# Patient Record
Sex: Female | Born: 1970 | Race: White | Hispanic: No | Marital: Married | Smoking: Never smoker
Health system: Southern US, Community
[De-identification: ages and names within clinical notes are randomized; demographics above are authoritative.]

## PROBLEM LIST (undated history)

## (undated) DIAGNOSIS — Z8051 Family history of malignant neoplasm of kidney: Secondary | ICD-10-CM

## (undated) DIAGNOSIS — Z8042 Family history of malignant neoplasm of prostate: Secondary | ICD-10-CM

## (undated) DIAGNOSIS — Z806 Family history of leukemia: Secondary | ICD-10-CM

## (undated) HISTORY — DX: Family history of malignant neoplasm of prostate: Z80.42

## (undated) HISTORY — DX: Family history of malignant neoplasm of kidney: Z80.51

## (undated) HISTORY — DX: Family history of leukemia: Z80.6

---

## 2010-01-13 ENCOUNTER — Ambulatory Visit: Payer: Self-pay | Admitting: Ophthalmology

## 2020-02-06 ENCOUNTER — Emergency Department (HOSPITAL_COMMUNITY)
Admission: EM | Admit: 2020-02-06 | Discharge: 2020-02-06 | Disposition: A | Discharge status: Home / Self Care | Payer: No Typology Code available for payment source | Attending: Emergency Medicine | Admitting: Emergency Medicine

## 2020-02-06 ENCOUNTER — Other Ambulatory Visit: Payer: Self-pay

## 2020-02-06 DIAGNOSIS — M79604 Pain in right leg: Secondary | ICD-10-CM | POA: Diagnosis present

## 2020-02-06 DIAGNOSIS — L539 Erythematous condition, unspecified: Secondary | ICD-10-CM | POA: Insufficient documentation

## 2020-02-06 MED ORDER — IBUPROFEN 800 MG PO TABS
800.0000 mg | ORAL_TABLET | Freq: Once | ORAL | Status: AC
Start: 2020-02-06 — End: 2020-02-06
  Administered 2020-02-06: 800 mg via ORAL
  Filled 2020-02-06: qty 1

## 2020-02-06 NOTE — ED Triage Notes (Signed)
Pt. States [facility] told pt. To come to ED for and ultrasound of the right leg. Pt. States they had a work injury on Monday to the right leg. Pt. Walked straight into a table and hit their right upper thigh. Tuesday when pt. Woke up they noticed a small knot and the area surrounding the knot became red. Pt. States the red area has grown and that the knot is still present.

## 2020-02-06 NOTE — Discharge Instructions (Addendum)
Call the number listed in the morning for an appointment time for your ultrasound tomorrow morning which has been ordered.  In the interim, I recommend warm compresses or a gentle heating pad to the site for 15 minutes several times daily.  You have been given an ibuprofen tablet here to help with inflammation and pain. You may take an additional tablet tomorrow morning before returning here for your test.  I have provided information about both thrombophlebitis and deep vein thromboses.  Given your exam and your mechanism of injury I suspect this is a superficial thrombophlebitis, however we will need this ultrasound to confirm this diagnosis.

## 2020-02-07 ENCOUNTER — Emergency Department (HOSPITAL_COMMUNITY)
Admission: RE | Admit: 2020-02-07 | Discharge: 2020-02-07 | Disposition: A | Discharge status: Home / Self Care | Payer: No Typology Code available for payment source | Source: Ambulatory Visit | Attending: Emergency Medicine | Admitting: Emergency Medicine

## 2020-02-07 NOTE — ED Notes (Signed)
Pt notified of negative DVT results

## 2020-02-08 NOTE — ED Provider Notes (Signed)
[facility] EMERGENCY DEPARTMENT Provider Note   CSN: [id number] Arrival date & time: 02/06/20  1735     History Chief Complaint  Patient presents with  . Leg Pain    Sherri Howell is a 49 y.o. 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  She initially noted a small knot at the site the next day, but the area has expanded including redness to the site.  She was seen by her pcp and sent here for an [place] to assess for possible dvt.  She denies sob, cp, no fevers or chills. She has employed rest of the extremity without improvement.  HPI     No past medical history on file.  There are no problems to display for this patient.      OB History   No obstetric history on file.     No family history on file.  Social History   Tobacco Use  . Smoking status: Not on file  Substance Use Topics  . Alcohol use: Not on file  . Drug use: Not on file    Home Medications Prior to Admission medications   Not on File    Allergies    Patient has no known allergies.  Review of Systems   Review of Systems  Constitutional: Negative for chills and fever.  HENT: Negative.   Eyes: Negative.   Respiratory: Negative for chest tightness and shortness of breath.   Cardiovascular: Positive for leg swelling. Negative for chest pain.  Genitourinary: Negative.   Musculoskeletal: Negative for arthralgias, joint swelling and neck pain.  Skin: Positive for color change. Negative for rash and wound.  Psychiatric/Behavioral: Negative.   All other systems reviewed and are negative.   Physical Exam Updated Vital Signs BP (!) 159/102   Pulse 73   Temp 98.1 F (36.7 C) (Oral)   Resp 18   Ht 5\' 9"  (1.753 m)   Wt 124.7 kg   SpO2 99%   BMI 40.61 kg/m   Physical Exam Vitals and nursing note reviewed.    Constitutional:      Appearance: She is well-developed.  HENT:     Head: Normocephalic and atraumatic.  Eyes:     Conjunctiva/sclera: Conjunctivae normal.  Cardiovascular:     Rate and Rhythm: Normal rate and regular rhythm.     Pulses: Normal pulses.     Heart sounds: Normal heart sounds.  Pulmonary:     Effort: Pulmonary effort is normal.     Breath sounds: Normal breath sounds. No wheezing.  Musculoskeletal:        General: Swelling present. Normal range of motion.     Comments: Approximate 6 cm linear palpable induration right distal medial thigh with surrounding erythema.  Skin intact.  No distal edema or erythema.  Skin:    General: Skin is warm and dry.     Capillary Refill: Capillary refill takes less than 2 seconds.     Findings: Erythema present.  Neurological:     Mental Status: She is alert.     ED Results / Procedures / Treatments   Labs (all labs ordered are listed, but only abnormal results are displayed) Labs Reviewed - No data to display  EKG None  Radiology US Venous Img Lower Unilateral Right  Result Date: 02/07/2020 CLINICAL DATA:  Right lower thigh pain EXAM: Right LOWER EXTREMITY VENOUS DOPPLER ULTRASOUND TECHNIQUE: Gray-scale sonography with compression, as well as color and duplex ultrasound, were performed to evaluate the deep venous system(s) from the level of the common femoral vein through the popliteal and proximal calf veins. COMPARISON:  None. FINDINGS: VENOUS Normal compressibility of the common femoral, superficial femoral, and popliteal veins, as well as the visualized calf veins. Visualized portions of profunda femoral vein and great saphenous vein unremarkable. No filling defects to suggest DVT on grayscale or color Doppler imaging. Doppler waveforms show normal direction of venous flow, normal respiratory plasticity and response to augmentation. Limited views of the contralateral common femoral vein are unremarkable. OTHER Superficial  varicosities in the region of patient's reported pain in the mid anterior thigh. There is superficial venous thrombosis of the varicosities. Limitations: Edema within the thigh soft tissues around the thrombosed varicosities. IMPRESSION: 1. Negative for acute DVT of the right lower extremity 2. In the region of redness and pain/injury, there is thrombosed superficial venous varicosity. Electronically Signed   By: Donavan Foil M.D.   On: 02/07/2020 17:31    Procedures Procedures (including critical care time)  Medications Ordered in ED Medications  ibuprofen (ADVIL) tablet 800 mg (800 mg Oral Given 02/06/20 2042)    ED Course  I have reviewed the triage vital signs and the nursing notes.  Pertinent labs & imaging results that were available during my care of the patient were reviewed by me and considered in my medical decision making (see chart for details).    MDM Rules/Calculators/A&P                          Pt with exam suspicious for superficial thrombophlebitis.  Unfortunately, [place] not available after hours. Testing was ordered for her to return in am. Instructions given for getting this test performed.  Given ibuprofen 800 mg, discussed heat tx.  Further management pending results of [place].  She was given printed information about both possible diagnoses.   Final Clinical Impression(s) / ED Diagnoses Final diagnoses:  Right leg pain    Rx / DC Orders ED Discharge Orders         Ordered    US Venous Img Lower Unilateral Right        02/06/20 1958           Evalee Jefferson, Hershal Coria 02/08/20 1813    Daleen Bo, MD 02/09/20 1515

## 2020-02-25 ENCOUNTER — Other Ambulatory Visit: Payer: Self-pay | Admitting: Internal Medicine

## 2020-02-25 DIAGNOSIS — Z1231 Encounter for screening mammogram for malignant neoplasm of breast: Secondary | ICD-10-CM

## 2020-03-11 ENCOUNTER — Other Ambulatory Visit: Payer: Self-pay

## 2020-03-11 ENCOUNTER — Ambulatory Visit
Admission: RE | Admit: 2020-03-11 | Discharge: 2020-03-11 | Disposition: A | Discharge status: Home / Self Care | Payer: BC Managed Care – PPO | Source: Ambulatory Visit | Attending: Internal Medicine | Admitting: Internal Medicine

## 2020-03-11 DIAGNOSIS — Z1231 Encounter for screening mammogram for malignant neoplasm of breast: Secondary | ICD-10-CM | POA: Diagnosis present

## 2020-03-23 ENCOUNTER — Other Ambulatory Visit: Payer: Self-pay | Admitting: *Deleted

## 2020-03-23 ENCOUNTER — Inpatient Hospital Stay
Admission: RE | Admit: 2020-03-23 | Discharge: 2020-03-23 | Disposition: A | Discharge status: Home / Self Care | Payer: Self-pay | Source: Ambulatory Visit | Attending: *Deleted | Admitting: *Deleted

## 2020-03-23 DIAGNOSIS — Z1231 Encounter for screening mammogram for malignant neoplasm of breast: Secondary | ICD-10-CM

## 2020-07-31 ENCOUNTER — Telehealth: Payer: Self-pay

## 2020-07-31 NOTE — Telephone Encounter (Signed)
Voicemail left with Ms. Vanderschaaf to return call. She is interested in seeing Dr. Janese Banks in medical oncology.

## 2020-08-04 ENCOUNTER — Telehealth: Payer: Self-pay

## 2020-08-04 NOTE — Telephone Encounter (Signed)
Called and spoke with Sherri Howell. She continues to recuperate from her surgery. Her final pathology is pending. She is interested in getting further treatment closer to home. She would like to get her final pathology and recommendations regarding chemotherapy prior to scheduling. Encouraged her to reach out or have [facility] send referral when she is ready.

## 2020-08-15 ENCOUNTER — Encounter: Payer: Self-pay | Admitting: Oncology

## 2020-08-15 DIAGNOSIS — C189 Malignant neoplasm of colon, unspecified: Secondary | ICD-10-CM

## 2020-08-15 HISTORY — DX: Malignant neoplasm of colon, unspecified: C18.9

## 2020-08-15 NOTE — Progress Notes (Signed)
[place]  Telephone:([phone]) [phone] Fax:(336) [phone]  ID: Rosealie Reach OB: 03/21/1971  MR#: [id number]  PZW#:[id number]  Patient Care Team: Kirk Ruths, MD as PCP - General (Internal Medicine)    CHIEF COMPLAINT: Colon cancer metastasized to liver.  INTERVAL HISTORY: Patient is a 50 year old female who initially presented to [facility] with vague abdominal pain and blood in her stool.  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  She underwent surgical resection of her primary mass as well as metastectomy of her liver lesions on July 28, 2020.  Patient is nearly fully recovered from her surgery and back to her baseline.  She has no neurologic complaints.  She denies any recent fevers or illnesses.  She had initial weight loss, but this is now stabilized.  She has no chest pain, shortness of breath, cough, or hemoptysis.  She denies any nausea, vomiting, constipation, or diarrhea.  She has no further melena or hematochezia.  She has no urinary complaints.  Patient offers no further specific complaints today.  REVIEW OF SYSTEMS:   Review of Systems  Constitutional: Negative.  Negative for fever, malaise/fatigue and weight loss.  Respiratory: Negative.  Negative for cough, hemoptysis and shortness of breath.   Cardiovascular: Negative.  Negative for chest pain and leg swelling.  Gastrointestinal: Negative.  Negative for abdominal pain, blood in stool and melena.  Genitourinary: Negative.  Negative for dysuria.  Musculoskeletal: Negative.  Negative for back pain.  Skin: Negative.  Negative for rash.  Neurological: Negative.  Negative for dizziness, seizures, weakness and headaches.  Psychiatric/Behavioral: Negative.  The patient is not nervous/anxious.     As per HPI. Otherwise, a complete review of systems is negative.  PAST MEDICAL HISTORY: Past Medical History:  Diagnosis Date  . Colon cancer ([place])  08/15/2020    PAST SURGICAL HISTORY: No past surgical history on file.  FAMILY HISTORY: Family History  Problem Relation Age of Onset  . Breast cancer Cousin        pat cousin    ADVANCED DIRECTIVES (Y/N):  N  HEALTH MAINTENANCE:     Colonoscopy:  PAP:  Bone density:  Lipid panel:  No Known Allergies  Current Outpatient Medications  Medication Sig Dispense Refill  . cetirizine (ZYRTEC) 10 MG tablet Take 1 tablet by mouth daily.    . cholecalciferol (VITAMIN D3) 25 MCG (1000 UNIT) tablet Take 1,000 Units by mouth daily.    Marland Kitchen docusate sodium (COLACE) 100 MG capsule Take 100 mg by mouth 2 (two) times daily.    Marland Kitchen enoxaparin (LOVENOX) 40 MG/0.4ML injection Inject 40 mg into the skin daily.    Marland Kitchen lidocaine-prilocaine (EMLA) cream Apply 1 application topically as needed. 30 g 3  . polyethylene glycol (MIRALAX / GLYCOLAX) 17 g packet Take 17 g by mouth daily.    . Omega-3 Fatty Acids (FISH OIL) 1000 MG CAPS Take 1 capsule by mouth daily.     No current facility-administered medications for this visit.    OBJECTIVE: Vitals:   08/20/20 1139  BP: 131/90  Pulse: 76  Temp: 98.5 F (36.9 C)     Body mass index is 38.99 kg/m.    ECOG FS:0 - Asymptomatic  General: Well-developed, well-nourished, no acute distress. Eyes: Pink conjunctiva, anicteric sclera. HEENT: Normocephalic, moist mucous membranes. Lungs: No audible wheezing or coughing. Heart: Regular rate and rhythm. Abdomen: Soft, nontender, no obvious distention. Musculoskeletal: No edema, cyanosis, or clubbing. Neuro: Alert, answering all questions appropriately. Cranial  nerves grossly intact. Skin: No rashes or petechiae noted. Psych: Normal affect. Lymphatics: No cervical, calvicular, axillary or inguinal LAD.   LAB RESULTS:  No results found for: NA, K, CL, CO2, GLUCOSE, BUN, CREATININE, CALCIUM, PROT, ALBUMIN, AST, ALT, ALKPHOS, BILITOT, GFRNONAA, GFRAA  No results found for: WBC, NEUTROABS, HGB, HCT, MCV,  PLT   STUDIES: No results found.  ASSESSMENT: Colon cancer metastasized to liver.  PLAN:    1. Colon cancer metastasized to liver: Patient initially presented to [facility] on on July 28, 2020 and underwent surgical intervention for metastatic colon cancer to the liver.  She was taken to the operating room by Cameron Sprang MD and Binnie Rail MD who performed a robot assisted laparoscopy, robot-assisted partial hepatectomy (Segment 4A/8), microwave ablation (x2, segments 7, 7/8), intraoperative ultrasound and robotic low anterior resection. Port has been placed.  Agree with recommendation for 12 cycles of adjuvant FOLFOX.  Patient will initiate treatment approximately 6 weeks after her surgery.  Return to clinic on Sep 08, 2020 to initiate cycle 1 of 12 of adjuvant FOLFOX. 2.  Genetics referral: Given patient's age, I referred patient to genetics for further evaluation.    I spent a total of 60 minutes reviewing chart data, face-to-face evaluation with the patient, counseling and coordination of care as detailed above.   Patient expressed understanding and was in agreement with this plan. She also understands that She can call clinic at any time with any questions, concerns, or complaints.   Cancer Staging Colon cancer [facility]) Staging form: Colon and Rectum, AJCC 8th Edition - Clinical stage from 08/15/2020: Stage IVA (cTX, cNX, pM1a) - Signed by Lloyd Huger, MD on 08/15/2020   Lloyd Huger, MD   08/21/2020 8:36 AM

## 2020-08-20 ENCOUNTER — Inpatient Hospital Stay: Payer: BC Managed Care – PPO

## 2020-08-20 ENCOUNTER — Inpatient Hospital Stay: Payer: BC Managed Care – PPO | Attending: Oncology | Admitting: Oncology

## 2020-08-20 ENCOUNTER — Other Ambulatory Visit: Payer: Self-pay

## 2020-08-20 VITALS — BP 131/90 | HR 76 | Temp 98.5°F | Ht 69.0 in | Wt 264.0 lb

## 2020-08-20 DIAGNOSIS — Z7901 Long term (current) use of anticoagulants: Secondary | ICD-10-CM | POA: Insufficient documentation

## 2020-08-20 DIAGNOSIS — C787 Secondary malignant neoplasm of liver and intrahepatic bile duct: Secondary | ICD-10-CM | POA: Insufficient documentation

## 2020-08-20 DIAGNOSIS — Z803 Family history of malignant neoplasm of breast: Secondary | ICD-10-CM | POA: Insufficient documentation

## 2020-08-20 DIAGNOSIS — C189 Malignant neoplasm of colon, unspecified: Secondary | ICD-10-CM | POA: Insufficient documentation

## 2020-08-20 MED ORDER — LIDOCAINE-PRILOCAINE 2.5-2.5 % EX CREA: 1.0000 "application " | TOPICAL_CREAM | CUTANEOUS | 3 refills | Status: AC | PRN

## 2020-08-21 MED ORDER — PROCHLORPERAZINE MALEATE 10 MG PO TABS: 10.0000 mg | ORAL_TABLET | Freq: Four times a day (QID) | ORAL | 1 refills | Status: DC | PRN

## 2020-08-21 MED ORDER — ONDANSETRON HCL 8 MG PO TABS
8.0000 mg | ORAL_TABLET | Freq: Two times a day (BID) | ORAL | 1 refills | Status: DC | PRN
Start: 2020-08-21 — End: 2021-01-05

## 2020-08-21 NOTE — Progress Notes (Signed)
START OFF PATHWAY REGIMEN - Colorectal   [id number] (Leucovorin IV D1 + Fluorouracil IV D1/CIV D1,2 + Oxaliplatin IV D1) q14 Days:   A cycle is every 14 days:     Oxaliplatin      Leucovorin      Fluorouracil      Fluorouracil   **Always confirm dose/schedule in your pharmacy ordering system**  Patient Characteristics: Postoperative without Neoadjuvant Therapy (Pathologic Staging), Distant Metastasis Tumor Location: Colon Therapeutic Status: Postoperative without Neoadjuvant Therapy (Pathologic Staging) AJCC M Category: pM1c AJCC T Category: pTX AJCC N Category: pNX AJCC 8 Stage Grouping: IVC Intent of Therapy: Curative Intent, Discussed with Patient

## 2020-08-24 NOTE — Patient Instructions (Signed)
Oxaliplatin Injection What is this medicine? OXALIPLATIN (ox AL i PLA tin) is a chemotherapy drug. It targets fast dividing cells, like cancer cells, and causes these cells to die. This medicine is used to treat cancers of the colon and rectum, and many other cancers. This medicine may be used for other purposes; ask your health care provider or pharmacist if you have questions. COMMON BRAND NAME(S): Eloxatin What should I tell my health care provider before I take this medicine? They need to know if you have any of these conditions:  heart disease  history of irregular heartbeat  liver disease  low blood counts, like white cells, platelets, or red blood cells  lung or breathing disease, like asthma  take medicines that treat or prevent blood clots  tingling of the fingers or toes, or other nerve disorder  an unusual or allergic reaction to oxaliplatin, other chemotherapy, other medicines, foods, dyes, or preservatives  pregnant or trying to get pregnant  breast-feeding How should I use this medicine? This drug is given as an infusion into a vein. It is administered in a hospital or clinic by a specially trained health care professional. Talk to your pediatrician regarding the use of this medicine in children. Special care may be needed. Overdosage: If you think you have taken too much of this medicine contact a poison control center or emergency room at once. NOTE: This medicine is only for you. Do not share this medicine with others. What if I miss a dose? It is important not to miss a dose. Call your doctor or health care professional if you are unable to keep an appointment. What may interact with this medicine? Do not take this medicine with any of the following medications:  cisapride  dronedarone  pimozide  thioridazine This medicine may also interact with the following medications:  aspirin and aspirin-like medicines  certain medicines that treat or prevent  blood clots like warfarin, apixaban, dabigatran, and rivaroxaban  cisplatin  cyclosporine  diuretics  medicines for infection like acyclovir, adefovir, amphotericin B, bacitracin, cidofovir, foscarnet, ganciclovir, gentamicin, pentamidine, vancomycin  NSAIDs, medicines for pain and inflammation, like ibuprofen or naproxen  other medicines that prolong the QT interval (an abnormal heart rhythm)  pamidronate  zoledronic acid This list may not describe all possible interactions. Give your health care provider a list of all the medicines, herbs, non-prescription drugs, or dietary supplements you use. Also tell them if you smoke, drink alcohol, or use illegal drugs. Some items may interact with your medicine. What should I watch for while using this medicine? Your condition will be monitored carefully while you are receiving this medicine. You may need blood work done while you are taking this medicine. This medicine may make you feel generally unwell. This is not uncommon as chemotherapy can affect healthy cells as well as cancer cells. Report any side effects. Continue your course of treatment even though you feel ill unless your healthcare professional tells you to stop. This medicine can make you more sensitive to cold. Do not drink cold drinks or use ice. Cover exposed skin before coming in contact with cold temperatures or cold objects. When out in cold weather wear warm clothing and cover your mouth and nose to warm the air that goes into your lungs. Tell your doctor if you get sensitive to the cold. Do not become pregnant while taking this medicine or for 9 months after stopping it. Women should inform their health care professional if they wish to become   pregnant or think they might be pregnant. Men should not father a child while taking this medicine and for 6 months after stopping it. There is potential for serious side effects to an unborn child. Talk to your health care professional  for more information. Do not breast-feed a child while taking this medicine or for 3 months after stopping it. This medicine has caused ovarian failure in some women. This medicine may make it more difficult to get pregnant. Talk to your health care professional if you are concerned about your fertility. This medicine has caused decreased sperm counts in some men. This may make it more difficult to father a child. Talk to your health care professional if you are concerned about your fertility. This medicine may increase your risk of getting an infection. Call your health care professional for advice if you get a fever, chills, or sore throat, or other symptoms of a cold or flu. Do not treat yourself. Try to avoid being around people who are sick. Avoid taking medicines that contain aspirin, acetaminophen, ibuprofen, naproxen, or ketoprofen unless instructed by your health care professional. These medicines may hide a fever. Be careful brushing or flossing your teeth or using a toothpick because you may get an infection or bleed more easily. If you have any dental work done, tell your dentist you are receiving this medicine. What side effects may I notice from receiving this medicine? Side effects that you should report to your doctor or health care professional as soon as possible:  allergic reactions like skin rash, itching or hives, swelling of the face, lips, or tongue  breathing problems  cough  low blood counts - this medicine may decrease the number of white blood cells, red blood cells, and platelets. You may be at increased risk for infections and bleeding  nausea, vomiting  pain, redness, or irritation at site where injected  pain, tingling, numbness in the hands or feet  signs and symptoms of bleeding such as bloody or black, tarry stools; red or dark brown urine; spitting up blood or brown material that looks like coffee grounds; red spots on the skin; unusual bruising or bleeding  from the eyes, gums, or nose  signs and symptoms of a dangerous change in heartbeat or heart rhythm like chest pain; dizziness; fast, irregular heartbeat; palpitations; feeling faint or lightheaded; falls  signs and symptoms of infection like fever; chills; cough; sore throat; pain or trouble passing urine  signs and symptoms of liver injury like dark yellow or brown urine; general ill feeling or flu-like symptoms; light-colored stools; loss of appetite; nausea; right upper belly pain; unusually weak or tired; yellowing of the eyes or skin  signs and symptoms of low red blood cells or anemia such as unusually weak or tired; feeling faint or lightheaded; falls  signs and symptoms of muscle injury like dark urine; trouble passing urine or change in the amount of urine; unusually weak or tired; muscle pain; back pain Side effects that usually do not require medical attention (report to your doctor or health care professional if they continue or are bothersome):  changes in taste  diarrhea  gas  hair loss  loss of appetite  mouth sores This list may not describe all possible side effects. Call your doctor for medical advice about side effects. You may report side effects to FDA at 1-800-FDA-1088. Where should I keep my medicine? This drug is given in a hospital or clinic and will not be stored at home. NOTE:   This sheet is a summary. It may not cover all possible information. If you have questions about this medicine, talk to your doctor, pharmacist, or health care provider.  2021 Elsevier/Gold Standard (2018-09-12 12:20:35) Fluorouracil, 5-FU injection What is this medicine? FLUOROURACIL, 5-FU (flure oh YOOR a sil) is a chemotherapy drug. It slows the growth of cancer cells. This medicine is used to treat many types of cancer like breast cancer, colon or rectal cancer, pancreatic cancer, and stomach cancer. This medicine may be used for other purposes; ask your health care provider or  pharmacist if you have questions. COMMON BRAND NAME(S): Adrucil What should I tell my health care provider before I take this medicine? They need to know if you have any of these conditions:  blood disorders  dihydropyrimidine dehydrogenase (DPD) deficiency  infection (especially a virus infection such as chickenpox, cold sores, or herpes)  kidney disease  liver disease  malnourished, poor nutrition  recent or ongoing radiation therapy  an unusual or allergic reaction to fluorouracil, other chemotherapy, other medicines, foods, dyes, or preservatives  pregnant or trying to get pregnant  breast-feeding How should I use this medicine? This drug is given as an infusion or injection into a vein. It is administered in a hospital or clinic by a specially trained health care professional. Talk to your pediatrician regarding the use of this medicine in children. Special care may be needed. Overdosage: If you think you have taken too much of this medicine contact a poison control center or emergency room at once. NOTE: This medicine is only for you. Do not share this medicine with others. What if I miss a dose? It is important not to miss your dose. Call your doctor or health care professional if you are unable to keep an appointment. What may interact with this medicine? Do not take this medicine with any of the following medications:  live virus vaccines This medicine may also interact with the following medications:  medicines that treat or prevent blood clots like warfarin, enoxaparin, and dalteparin This list may not describe all possible interactions. Give your health care provider a list of all the medicines, herbs, non-prescription drugs, or dietary supplements you use. Also tell them if you smoke, drink alcohol, or use illegal drugs. Some items may interact with your medicine. What should I watch for while using this medicine? Visit your doctor for checks on your progress.  This drug may make you feel generally unwell. This is not uncommon, as chemotherapy can affect healthy cells as well as cancer cells. Report any side effects. Continue your course of treatment even though you feel ill unless your doctor tells you to stop. In some cases, you may be given additional medicines to help with side effects. Follow all directions for their use. Call your doctor or health care professional for advice if you get a fever, chills or sore throat, or other symptoms of a cold or flu. Do not treat yourself. This drug decreases your body's ability to fight infections. Try to avoid being around people who are sick. This medicine may increase your risk to bruise or bleed. Call your doctor or health care professional if you notice any unusual bleeding. Be careful brushing and flossing your teeth or using a toothpick because you may get an infection or bleed more easily. If you have any dental work done, tell your dentist you are receiving this medicine. Avoid taking products that contain aspirin, acetaminophen, ibuprofen, naproxen, or ketoprofen unless instructed by your doctor.  These medicines may hide a fever. Do not become pregnant while taking this medicine. Women should inform their doctor if they wish to become pregnant or think they might be pregnant. There is a potential for serious side effects to an unborn child. Talk to your health care professional or pharmacist for more information. Do not breast-feed an infant while taking this medicine. Men should inform their doctor if they wish to father a child. This medicine may lower sperm counts. Do not treat diarrhea with over the counter products. Contact your doctor if you have diarrhea that lasts more than 2 days or if it is severe and watery. This medicine can make you more sensitive to the sun. Keep out of the sun. If you cannot avoid being in the sun, wear protective clothing and use sunscreen. Do not use sun lamps or tanning  beds/booths. What side effects may I notice from receiving this medicine? Side effects that you should report to your doctor or health care professional as soon as possible:  allergic reactions like skin rash, itching or hives, swelling of the face, lips, or tongue  low blood counts - this medicine may decrease the number of white blood cells, red blood cells and platelets. You may be at increased risk for infections and bleeding.  signs of infection - fever or chills, cough, sore throat, pain or difficulty passing urine  signs of decreased platelets or bleeding - bruising, pinpoint red spots on the skin, black, tarry stools, blood in the urine  signs of decreased red blood cells - unusually weak or tired, fainting spells, lightheadedness  breathing problems  changes in vision  chest pain  mouth sores  nausea and vomiting  pain, swelling, redness at site where injected  pain, tingling, numbness in the hands or feet  redness, swelling, or sores on hands or feet  stomach pain  unusual bleeding Side effects that usually do not require medical attention (report to your doctor or health care professional if they continue or are bothersome):  changes in finger or toe nails  diarrhea  dry or itchy skin  hair loss  headache  loss of appetite  sensitivity of eyes to the light  stomach upset  unusually teary eyes This list may not describe all possible side effects. Call your doctor for medical advice about side effects. You may report side effects to FDA at 1-800-FDA-1088. Where should I keep my medicine? This drug is given in a hospital or clinic and will not be stored at home. NOTE: This sheet is a summary. It may not cover all possible information. If you have questions about this medicine, talk to your doctor, pharmacist, or health care provider.  2021 Elsevier/Gold Standard (2019-03-26 15:00:03) Leucovorin injection What is this medicine? LEUCOVORIN (loo koe  VOR in) is used to prevent or treat the harmful effects of some medicines. This medicine is used to treat anemia caused by a low amount of folic acid in the body. It is also used with 5-fluorouracil (5-FU) to treat colon cancer. This medicine may be used for other purposes; ask your health care provider or pharmacist if you have questions. What should I tell my health care provider before I take this medicine? They need to know if you have any of these conditions:  anemia from low levels of vitamin B-12 in the blood  an unusual or allergic reaction to leucovorin, folic acid, other medicines, foods, dyes, or preservatives  pregnant or trying to get pregnant  breast-feeding How  should I use this medicine? This medicine is for injection into a muscle or into a vein. It is given by a health care professional in a hospital or clinic setting. Talk to your pediatrician regarding the use of this medicine in children. Special care may be needed. Overdosage: If you think you have taken too much of this medicine contact a poison control center or emergency room at once. NOTE: This medicine is only for you. Do not share this medicine with others. What if I miss a dose? This does not apply. What may interact with this medicine?  capecitabine  fluorouracil  phenobarbital  phenytoin  primidone  trimethoprim-sulfamethoxazole This list may not describe all possible interactions. Give your health care provider a list of all the medicines, herbs, non-prescription drugs, or dietary supplements you use. Also tell them if you smoke, drink alcohol, or use illegal drugs. Some items may interact with your medicine. What should I watch for while using this medicine? Your condition will be monitored carefully while you are receiving this medicine. This medicine may increase the side effects of 5-fluorouracil, 5-FU. Tell your doctor or health care professional if you have diarrhea or mouth sores that do not  get better or that get worse. What side effects may I notice from receiving this medicine? Side effects that you should report to your doctor or health care professional as soon as possible:  allergic reactions like skin rash, itching or hives, swelling of the face, lips, or tongue  breathing problems  fever, infection  mouth sores  unusual bleeding or bruising  unusually weak or tired Side effects that usually do not require medical attention (report to your doctor or health care professional if they continue or are bothersome):  constipation or diarrhea  loss of appetite  nausea, vomiting This list may not describe all possible side effects. Call your doctor for medical advice about side effects. You may report side effects to FDA at 1-800-FDA-1088. Where should I keep my medicine? This drug is given in a hospital or clinic and will not be stored at home. NOTE: This sheet is a summary. It may not cover all possible information. If you have questions about this medicine, talk to your doctor, pharmacist, or health care provider.  2021 Elsevier/Gold Standard (2007-10-30 16:50:29)

## 2020-08-25 ENCOUNTER — Inpatient Hospital Stay: Payer: BC Managed Care – PPO

## 2020-09-05 NOTE — Progress Notes (Signed)
[facility]  [phone] [phone]) [phone]  ID: Sherri Howell OB: 1971-04-01  MR#: [id number]  PXT#:[id number]  Patient Care Team: Kirk Ruths, MD as PCP - General (Internal Medicine)    CHIEF COMPLAINT: Colon cancer metastasized to liver.  INTERVAL HISTORY: Patient returns to clinic today for further evaluation and initiation of cycle 1 of 12 of adjuvant FOLFOX.  She currently feels well and is asymptomatic. She has no neurologic complaints.  She denies any recent fevers or illnesses.  She has a fair appetite and denies weight loss.  She has no chest pain, shortness of breath, cough, or hemoptysis.  She denies any nausea, vomiting, constipation, or diarrhea.  She has no further melena or hematochezia.  She has no urinary complaints.  Patient offers no specific complaints today.  REVIEW OF SYSTEMS:   Review of Systems  Constitutional: Negative.  Negative for fever, malaise/fatigue and weight loss.  Respiratory: Negative.  Negative for cough, hemoptysis and shortness of breath.   Cardiovascular: Negative.  Negative for chest pain and leg swelling.  Gastrointestinal: Negative.  Negative for abdominal pain, blood in stool and melena.  Genitourinary: Negative.  Negative for dysuria.  Musculoskeletal: Negative.  Negative for back pain.  Skin: Negative.  Negative for rash.  Neurological: Negative.  Negative for dizziness, seizures, weakness and headaches.  Psychiatric/Behavioral: Negative.  The patient is not nervous/anxious.     As per HPI. Otherwise, a complete review of systems is negative.  PAST MEDICAL HISTORY: Past Medical History:  Diagnosis Date  . Colon cancer ([place]) 08/15/2020    PAST SURGICAL HISTORY: No past surgical history on file.  FAMILY HISTORY: Family History  Problem Relation Age of Onset  . Breast cancer Cousin        pat cousin    ADVANCED DIRECTIVES (Y/N):  N  HEALTH MAINTENANCE: Social History   Substance Use  Topics  . Alcohol use: Never  . Drug use: Never     Colonoscopy:  PAP:  Bone density:  Lipid panel:  No Known Allergies  Current Outpatient Medications  Medication Sig Dispense Refill  . cetirizine (ZYRTEC) 10 MG tablet Take 1 tablet by mouth daily.    . cholecalciferol (VITAMIN D3) 25 MCG (1000 UNIT) tablet Take 1,000 Units by mouth daily.    Marland Kitchen docusate sodium (COLACE) 100 MG capsule Take 100 mg by mouth 2 (two) times daily.    Marland Kitchen lidocaine-prilocaine (EMLA) cream Apply 1 application topically as needed. 30 g 3  . Omega-3 Fatty Acids (FISH OIL) 1000 MG CAPS Take 1 capsule by mouth daily.    . ondansetron (ZOFRAN) 8 MG tablet Take 1 tablet (8 mg total) by mouth 2 (two) times daily as needed for refractory nausea / vomiting. 60 tablet 1  . polyethylene glycol (MIRALAX / GLYCOLAX) 17 g packet Take 17 g by mouth daily.    . prochlorperazine (COMPAZINE) 10 MG tablet Take 1 tablet (10 mg total) by mouth every 6 (six) hours as needed (Nausea or vomiting). 60 tablet 1  . enoxaparin (LOVENOX) 40 MG/0.4ML injection Inject 40 mg into the skin daily.     No current facility-administered medications for this visit.   Facility-Administered Medications Ordered in Other Visits  Medication Dose Route Frequency Provider Last Rate Last Admin  . fluorouracil (ADRUCIL) 5,800 mg in sodium chloride 0.9 % 134 mL chemo infusion  2,400 mg/m2 (Treatment Plan Recorded) Intravenous 1 day or 1 dose Lloyd Huger, MD   5,800 mg at 09/08/20 1330  .  heparin lock flush 100 unit/mL  500 Units Intravenous Once Lloyd Huger, MD      . sodium chloride flush (NS) 0.9 % injection 10 mL  10 mL Intracatheter PRN Lloyd Huger, MD        OBJECTIVE: Vitals:   09/08/20 0911  BP: 130/89  Pulse: 85  Resp: 18  Temp: (!) 97.5 F (36.4 C)  SpO2: 100%     Body mass index is 39.56 kg/m.    ECOG FS:0 - Asymptomatic  General: Well-developed, well-nourished, no acute distress. Eyes: Pink conjunctiva,  anicteric sclera. HEENT: Normocephalic, moist mucous membranes. Lungs: No audible wheezing or coughing. Heart: Regular rate and rhythm. Abdomen: Soft, nontender, no obvious distention. Musculoskeletal: No edema, cyanosis, or clubbing. Neuro: Alert, answering all questions appropriately. Cranial nerves grossly intact. Skin: No rashes or petechiae noted. Psych: Normal affect.  LAB RESULTS:  Lab Results  Component Value Date   NA 136 09/08/2020   K 3.7 09/08/2020   CL 101 09/08/2020   CO2 25 09/08/2020   GLUCOSE 133 (H) 09/08/2020   BUN 15 09/08/2020   CREATININE 0.85 09/08/2020   CALCIUM 10.9 (H) 09/08/2020   PROT 7.2 09/08/2020   ALBUMIN 3.8 09/08/2020   AST 20 09/08/2020   ALT 15 09/08/2020   ALKPHOS 57 09/08/2020   BILITOT 0.5 09/08/2020   GFRNONAA >60 09/08/2020    Lab Results  Component Value Date   WBC 6.9 09/08/2020   NEUTROABS 3.9 09/08/2020   HGB 14.0 09/08/2020   HCT 42.7 09/08/2020   MCV 94.7 09/08/2020   PLT 313 09/08/2020     STUDIES: No results found.  ASSESSMENT: Colon cancer metastasized to liver.  PLAN:    1. Colon cancer metastasized to liver: Patient initially presented to [facility] on on July 28, 2020 and underwent surgical intervention for metastatic colon cancer to the liver.  She was taken to the operating room by Cameron Sprang MD and Binnie Rail MD who performed a robot assisted laparoscopy, robot-assisted partial hepatectomy (Segment 4A/8), microwave ablation (x2, segments 7, 7/8), intraoperative ultrasound and robotic low anterior resection. Port has been placed.  Proceed with cycle 1 of 12 of adjuvant FOLFOX today.  Return to clinic in 2 days for pump removal, 1 week for laboratory work and video assisted telemedicine visit, and then in 2 weeks for further evaluation and consideration of cycle 2. 2.  Genetics referral: Pending at time of dictation.  I spent a total of 30 minutes reviewing chart data, face-to-face  evaluation with the patient, counseling and coordination of care as detailed above.   Patient expressed understanding and was in agreement with this plan. She also understands that She can call clinic at any time with any questions, concerns, or complaints.   Cancer Staging Colon cancer [facility]) Staging form: Colon and Rectum, AJCC 8th Edition - Clinical stage from 08/15/2020: Stage IVA (cTX, cNX, pM1a) - Signed by Lloyd Huger, MD on 08/15/2020   Lloyd Huger, MD   09/08/2020 2:34 PM

## 2020-09-08 ENCOUNTER — Inpatient Hospital Stay: Payer: BC Managed Care – PPO

## 2020-09-08 ENCOUNTER — Other Ambulatory Visit: Payer: Self-pay

## 2020-09-08 ENCOUNTER — Inpatient Hospital Stay: Payer: BC Managed Care – PPO | Attending: Oncology

## 2020-09-08 ENCOUNTER — Inpatient Hospital Stay (HOSPITAL_BASED_OUTPATIENT_CLINIC_OR_DEPARTMENT_OTHER): Payer: BC Managed Care – PPO | Admitting: Oncology

## 2020-09-08 ENCOUNTER — Encounter: Payer: Self-pay | Admitting: Oncology

## 2020-09-08 VITALS — BP 130/89 | HR 85 | Temp 97.5°F | Resp 18 | Ht 69.0 in | Wt 267.9 lb

## 2020-09-08 DIAGNOSIS — C189 Malignant neoplasm of colon, unspecified: Secondary | ICD-10-CM | POA: Diagnosis not present

## 2020-09-08 DIAGNOSIS — C787 Secondary malignant neoplasm of liver and intrahepatic bile duct: Secondary | ICD-10-CM | POA: Insufficient documentation

## 2020-09-08 DIAGNOSIS — C187 Malignant neoplasm of sigmoid colon: Secondary | ICD-10-CM | POA: Insufficient documentation

## 2020-09-08 DIAGNOSIS — Z5111 Encounter for antineoplastic chemotherapy: Secondary | ICD-10-CM | POA: Diagnosis present

## 2020-09-08 DIAGNOSIS — Z8051 Family history of malignant neoplasm of kidney: Secondary | ICD-10-CM | POA: Insufficient documentation

## 2020-09-08 DIAGNOSIS — Z800 Family history of malignant neoplasm of digestive organs: Secondary | ICD-10-CM | POA: Diagnosis not present

## 2020-09-08 DIAGNOSIS — Z806 Family history of leukemia: Secondary | ICD-10-CM | POA: Insufficient documentation

## 2020-09-08 DIAGNOSIS — Z8042 Family history of malignant neoplasm of prostate: Secondary | ICD-10-CM | POA: Insufficient documentation

## 2020-09-08 LAB — CBC WITH DIFFERENTIAL/PLATELET
Abs Immature Granulocytes: 0.03 10*3/uL (ref 0.00–0.07)
Basophils Absolute: 0 10*3/uL (ref 0.0–0.1)
Basophils Relative: 0 %
Eosinophils Absolute: 0.6 10*3/uL — ABNORMAL HIGH (ref 0.0–0.5)
Eosinophils Relative: 8 %
HCT: 42.7 % (ref 36.0–46.0)
Hemoglobin: 14 g/dL (ref 12.0–15.0)
Immature Granulocytes: 0 %
Lymphocytes Relative: 26 %
Lymphs Abs: 1.8 10*3/uL (ref 0.7–4.0)
MCH: 31 pg (ref 26.0–34.0)
MCHC: 32.8 g/dL (ref 30.0–36.0)
MCV: 94.7 fL (ref 80.0–100.0)
Monocytes Absolute: 0.6 10*3/uL (ref 0.1–1.0)
Monocytes Relative: 8 %
Neutro Abs: 3.9 10*3/uL (ref 1.7–7.7)
Neutrophils Relative %: 58 %
Platelets: 313 10*3/uL (ref 150–400)
RBC: 4.51 MIL/uL (ref 3.87–5.11)
RDW: 13.2 % (ref 11.5–15.5)
WBC: 6.9 10*3/uL (ref 4.0–10.5)
nRBC: 0 % (ref 0.0–0.2)

## 2020-09-08 LAB — COMPREHENSIVE METABOLIC PANEL
ALT: 15 U/L (ref 0–44)
AST: 20 U/L (ref 15–41)
Albumin: 3.8 g/dL (ref 3.5–5.0)
Alkaline Phosphatase: 57 U/L (ref 38–126)
Anion gap: 10 (ref 5–15)
BUN: 15 mg/dL (ref 6–20)
CO2: 25 mmol/L (ref 22–32)
Calcium: 10.9 mg/dL — ABNORMAL HIGH (ref 8.9–10.3)
Chloride: 101 mmol/L (ref 98–111)
Creatinine, Ser: 0.85 mg/dL (ref 0.44–1.00)
GFR, Estimated: >60 mL/min (ref >60)
Glucose, Bld: 133 mg/dL — ABNORMAL HIGH (ref 70–99)
Potassium: 3.7 mmol/L (ref 3.5–5.1)
Sodium: 136 mmol/L (ref 135–145)
Total Bilirubin: 0.5 mg/dL (ref 0.3–1.2)
Total Protein: 7.2 g/dL (ref 6.5–8.1)

## 2020-09-08 MED ORDER — HEPARIN SOD (PORK) LOCK FLUSH 100 UNIT/ML IV SOLN
500.0000 [IU] | Freq: Once | INTRAVENOUS | Status: DC
  Filled 2020-09-08: qty 5

## 2020-09-08 MED ORDER — LEUCOVORIN CALCIUM INJECTION 350 MG
950.0000 mg | Freq: Once | INTRAVENOUS | Status: AC
  Administered 2020-09-08: 950 mg via INTRAVENOUS
  Filled 2020-09-08: qty 47.5

## 2020-09-08 MED ORDER — OXALIPLATIN CHEMO INJECTION 100 MG/20ML
200.0000 mg | Freq: Once | INTRAVENOUS | Status: AC
  Administered 2020-09-08: 200 mg via INTRAVENOUS
  Filled 2020-09-08: qty 40

## 2020-09-08 MED ORDER — SODIUM CHLORIDE 0.9% FLUSH
10.0000 mL | INTRAVENOUS | Status: DC | PRN
  Filled 2020-09-08: qty 10

## 2020-09-08 MED ORDER — SODIUM CHLORIDE 0.9 % IV SOLN
10.0000 mg | Freq: Once | INTRAVENOUS | Status: AC
  Administered 2020-09-08: 10 mg via INTRAVENOUS
  Filled 2020-09-08: qty 10

## 2020-09-08 MED ORDER — PALONOSETRON HCL INJECTION 0.25 MG/5ML
0.2500 mg | Freq: Once | INTRAVENOUS | Status: AC
  Administered 2020-09-08: 0.25 mg via INTRAVENOUS
  Filled 2020-09-08: qty 5

## 2020-09-08 MED ORDER — DEXTROSE 5 % IV SOLN
Freq: Once | INTRAVENOUS | Status: AC
  Filled 2020-09-08: qty 250

## 2020-09-08 MED ORDER — SODIUM CHLORIDE 0.9 % IV SOLN
2400.0000 mg/m2 | INTRAVENOUS | Status: DC
  Administered 2020-09-08: 5800 mg via INTRAVENOUS
  Filled 2020-09-08: qty 116

## 2020-09-08 MED ORDER — SODIUM CHLORIDE 0.9% FLUSH
10.0000 mL | Freq: Once | INTRAVENOUS | Status: AC
  Administered 2020-09-08: 10 mL via INTRAVENOUS
  Filled 2020-09-08: qty 10

## 2020-09-08 MED ORDER — FLUOROURACIL CHEMO INJECTION 2.5 GM/50ML
400.0000 mg/m2 | Freq: Once | INTRAVENOUS | Status: AC
  Administered 2020-09-08: 950 mg via INTRAVENOUS
  Filled 2020-09-08: qty 19

## 2020-09-08 NOTE — Patient Instructions (Signed)
[facility]  Discharge Instructions: Thank you for choosing [facility] to provide your oncology and hematology care.  If you have a lab appointment with the [facility], please go directly to the [facility] and check in at the registration area.  Wear comfortable clothing and clothing appropriate for easy access to any Portacath or PICC line.   We strive to give you quality time with your provider. You may need to reschedule your appointment if you arrive late (15 or more minutes).  Arriving late affects you and other patients whose appointments are after yours.  Also, if you miss three or more appointments without notifying the office, you may be dismissed from the clinic at the provider's discretion.      For prescription refill requests, have your pharmacy contact our office and allow 72 hours for refills to be completed.    Today you received the following chemotherapy and/or immunotherapy agents: oxaliplatin, leucovorin, fluorouracil      To help prevent nausea and vomiting after your treatment, we encourage you to take your nausea medication as directed.  BELOW ARE SYMPTOMS THAT SHOULD BE REPORTED IMMEDIATELY: . *FEVER GREATER THAN 100.4 F (38 C) OR HIGHER . *CHILLS OR SWEATING . *NAUSEA AND VOMITING THAT IS NOT CONTROLLED WITH YOUR NAUSEA MEDICATION . *UNUSUAL SHORTNESS OF BREATH . *UNUSUAL BRUISING OR BLEEDING . *URINARY PROBLEMS (pain or burning when urinating, or frequent urination) . *BOWEL PROBLEMS (unusual diarrhea, constipation, pain near the anus) . TENDERNESS IN MOUTH AND THROAT WITH OR WITHOUT PRESENCE OF ULCERS (sore throat, sores in mouth, or a toothache) . UNUSUAL RASH, SWELLING OR PAIN  . UNUSUAL VAGINAL DISCHARGE OR ITCHING   Items with * indicate a potential emergency and should be followed up as soon as possible or go to the Emergency Department if any problems should occur.  Please show the CHEMOTHERAPY  ALERT CARD or IMMUNOTHERAPY ALERT CARD at check-in to the Emergency Department and triage nurse.  Should you have questions after your visit or need to cancel or reschedule your appointment, please contact [facility]  [phone] and follow the prompts.  Office hours are 8:00 a.m. to 4:30 p.m. Monday - Friday. Please note that voicemails left after 4:00 p.m. may not be returned until the following business day.  We are closed weekends and major holidays. You have access to a nurse at all times for urgent questions. Please call the main number to the clinic [phone] and follow the prompts.  For any non-urgent questions, you may also contact your provider using MyChart. We now offer e-Visits for anyone 7 and older to request care online for non-urgent symptoms. For details visit [email or web address].   Also download the MyChart app! Go to the app store, search "MyChart", open the app, select [facility], and log in with your MyChart username and password.  Due to Covid, a mask is required upon entering the hospital/clinic. If you do not have a mask, one will be given to you upon arrival. For doctor visits, patients may have 1 support person aged 19 or older with them. For treatment visits, patients cannot have anyone with them due to current Covid guidelines and our immunocompromised population.    Oxaliplatin Injection What is this medicine? OXALIPLATIN (ox AL i PLA tin) is a chemotherapy drug. It targets fast dividing cells, like cancer cells, and causes these cells to die. This medicine is used to treat cancers of the colon and rectum,  and many other cancers. This medicine may be used for other purposes; ask your health care provider or pharmacist if you have questions. COMMON BRAND NAME(S): Eloxatin What should I tell my health care provider before I take this medicine? They need to know if you have any of these conditions:  heart disease  history  of irregular heartbeat  liver disease  low blood counts, like white cells, platelets, or red blood cells  lung or breathing disease, like asthma  take medicines that treat or prevent blood clots  tingling of the fingers or toes, or other nerve disorder  an unusual or allergic reaction to oxaliplatin, other chemotherapy, other medicines, foods, dyes, or preservatives  pregnant or trying to get pregnant  breast-feeding How should I use this medicine? This drug is given as an infusion into a vein. It is administered in a hospital or clinic by a specially trained health care professional. Talk to your pediatrician regarding the use of this medicine in children. Special care may be needed. Overdosage: If you think you have taken too much of this medicine contact a poison control center or emergency room at once. NOTE: This medicine is only for you. Do not share this medicine with others. What if I miss a dose? It is important not to miss a dose. Call your doctor or health care professional if you are unable to keep an appointment. What may interact with this medicine? Do not take this medicine with any of the following medications:  cisapride  dronedarone  pimozide  thioridazine This medicine may also interact with the following medications:  aspirin and aspirin-like medicines  certain medicines that treat or prevent blood clots like warfarin, apixaban, dabigatran, and rivaroxaban  cisplatin  cyclosporine  diuretics  medicines for infection like acyclovir, adefovir, amphotericin B, bacitracin, cidofovir, foscarnet, ganciclovir, gentamicin, pentamidine, vancomycin  NSAIDs, medicines for pain and inflammation, like ibuprofen or naproxen  other medicines that prolong the QT interval (an abnormal heart rhythm)  pamidronate  zoledronic acid This list may not describe all possible interactions. Give your health care provider a list of all the medicines, herbs,  non-prescription drugs, or dietary supplements you use. Also tell them if you smoke, drink alcohol, or use illegal drugs. Some items may interact with your medicine. What should I watch for while using this medicine? Your condition will be monitored carefully while you are receiving this medicine. You may need blood work done while you are taking this medicine. This medicine may make you feel generally unwell. This is not uncommon as chemotherapy can affect healthy cells as well as cancer cells. Report any side effects. Continue your course of treatment even though you feel ill unless your healthcare professional tells you to stop. This medicine can make you more sensitive to cold. Do not drink cold drinks or use ice. Cover exposed skin before coming in contact with cold temperatures or cold objects. When out in cold weather wear warm clothing and cover your mouth and nose to warm the air that goes into your lungs. Tell your doctor if you get sensitive to the cold. Do not become pregnant while taking this medicine or for 9 months after stopping it. Women should inform their health care professional if they wish to become pregnant or think they might be pregnant. Men should not father a child while taking this medicine and for 6 months after stopping it. There is potential for serious side effects to an unborn child. Talk to your health care professional  for more information. Do not breast-feed a child while taking this medicine or for 3 months after stopping it. This medicine has caused ovarian failure in some women. This medicine may make it more difficult to get pregnant. Talk to your health care professional if you are concerned about your fertility. This medicine has caused decreased sperm counts in some men. This may make it more difficult to father a child. Talk to your health care professional if you are concerned about your fertility. This medicine may increase your risk of getting an infection.  Call your health care professional for advice if you get a fever, chills, or sore throat, or other symptoms of a cold or flu. Do not treat yourself. Try to avoid being around people who are sick. Avoid taking medicines that contain aspirin, acetaminophen, ibuprofen, naproxen, or ketoprofen unless instructed by your health care professional. These medicines may hide a fever. Be careful brushing or flossing your teeth or using a toothpick because you may get an infection or bleed more easily. If you have any dental work done, tell your dentist you are receiving this medicine. What side effects may I notice from receiving this medicine? Side effects that you should report to your doctor or health care professional as soon as possible:  allergic reactions like skin rash, itching or hives, swelling of the face, lips, or tongue  breathing problems  cough  low blood counts - this medicine may decrease the number of white blood cells, red blood cells, and platelets. You may be at increased risk for infections and bleeding  nausea, vomiting  pain, redness, or irritation at site where injected  pain, tingling, numbness in the hands or feet  signs and symptoms of bleeding such as bloody or black, tarry stools; red or dark brown urine; spitting up blood or brown material that looks like coffee grounds; red spots on the skin; unusual bruising or bleeding from the eyes, gums, or nose  signs and symptoms of a dangerous change in heartbeat or heart rhythm like chest pain; dizziness; fast, irregular heartbeat; palpitations; feeling faint or lightheaded; falls  signs and symptoms of infection like fever; chills; cough; sore throat; pain or trouble passing urine  signs and symptoms of liver injury like dark yellow or brown urine; general ill feeling or flu-like symptoms; light-colored stools; loss of appetite; nausea; right upper belly pain; unusually weak or tired; yellowing of the eyes or skin  signs and  symptoms of low red blood cells or anemia such as unusually weak or tired; feeling faint or lightheaded; falls  signs and symptoms of muscle injury like dark urine; trouble passing urine or change in the amount of urine; unusually weak or tired; muscle pain; back pain Side effects that usually do not require medical attention (report to your doctor or health care professional if they continue or are bothersome):  changes in taste  diarrhea  gas  hair loss  loss of appetite  mouth sores This list may not describe all possible side effects. Call your doctor for medical advice about side effects. You may report side effects to FDA at 1-800-FDA-1088. Where should I keep my medicine? This drug is given in a hospital or clinic and will not be stored at home. NOTE: This sheet is a summary. It may not cover all possible information. If you have questions about this medicine, talk to your doctor, pharmacist, or health care provider.  2021 Elsevier/Gold Standard (2018-09-12 12:20:35)  Leucovorin injection What is this medicine?  LEUCOVORIN (loo koe VOR in) is used to prevent or treat the harmful effects of some medicines. This medicine is used to treat anemia caused by a low amount of folic acid in the body. It is also used with 5-fluorouracil (5-FU) to treat colon cancer. This medicine may be used for other purposes; ask your health care provider or pharmacist if you have questions. What should I tell my health care provider before I take this medicine? They need to know if you have any of these conditions:  anemia from low levels of vitamin B-12 in the blood  an unusual or allergic reaction to leucovorin, folic acid, other medicines, foods, dyes, or preservatives  pregnant or trying to get pregnant  breast-feeding How should I use this medicine? This medicine is for injection into a muscle or into a vein. It is given by a health care professional in a hospital or clinic setting. Talk to  your pediatrician regarding the use of this medicine in children. Special care may be needed. Overdosage: If you think you have taken too much of this medicine contact a poison control center or emergency room at once. NOTE: This medicine is only for you. Do not share this medicine with others. What if I miss a dose? This does not apply. What may interact with this medicine?  capecitabine  fluorouracil  phenobarbital  phenytoin  primidone  trimethoprim-sulfamethoxazole This list may not describe all possible interactions. Give your health care provider a list of all the medicines, herbs, non-prescription drugs, or dietary supplements you use. Also tell them if you smoke, drink alcohol, or use illegal drugs. Some items may interact with your medicine. What should I watch for while using this medicine? Your condition will be monitored carefully while you are receiving this medicine. This medicine may increase the side effects of 5-fluorouracil, 5-FU. Tell your doctor or health care professional if you have diarrhea or mouth sores that do not get better or that get worse. What side effects may I notice from receiving this medicine? Side effects that you should report to your doctor or health care professional as soon as possible:  allergic reactions like skin rash, itching or hives, swelling of the face, lips, or tongue  breathing problems  fever, infection  mouth sores  unusual bleeding or bruising  unusually weak or tired Side effects that usually do not require medical attention (report to your doctor or health care professional if they continue or are bothersome):  constipation or diarrhea  loss of appetite  nausea, vomiting This list may not describe all possible side effects. Call your doctor for medical advice about side effects. You may report side effects to FDA at 1-800-FDA-1088. Where should I keep my medicine? This drug is given in a hospital or clinic and will  not be stored at home. NOTE: This sheet is a summary. It may not cover all possible information. If you have questions about this medicine, talk to your doctor, pharmacist, or health care provider.  2021 Elsevier/Gold Standard (2007-10-30 16:50:29)  Fluorouracil, 5-FU injection What is this medicine? FLUOROURACIL, 5-FU (flure oh YOOR a sil) is a chemotherapy drug. It slows the growth of cancer cells. This medicine is used to treat many types of cancer like breast cancer, colon or rectal cancer, pancreatic cancer, and stomach cancer. This medicine may be used for other purposes; ask your health care provider or pharmacist if you have questions. COMMON BRAND NAME(S): Adrucil What should I tell my health care provider before  I take this medicine? They need to know if you have any of these conditions:  blood disorders  dihydropyrimidine dehydrogenase (DPD) deficiency  infection (especially a virus infection such as chickenpox, cold sores, or herpes)  kidney disease  liver disease  malnourished, poor nutrition  recent or ongoing radiation therapy  an unusual or allergic reaction to fluorouracil, other chemotherapy, other medicines, foods, dyes, or preservatives  pregnant or trying to get pregnant  breast-feeding How should I use this medicine? This drug is given as an infusion or injection into a vein. It is administered in a hospital or clinic by a specially trained health care professional. Talk to your pediatrician regarding the use of this medicine in children. Special care may be needed. Overdosage: If you think you have taken too much of this medicine contact a poison control center or emergency room at once. NOTE: This medicine is only for you. Do not share this medicine with others. What if I miss a dose? It is important not to miss your dose. Call your doctor or health care professional if you are unable to keep an appointment. What may interact with this medicine? Do not  take this medicine with any of the following medications:  live virus vaccines This medicine may also interact with the following medications:  medicines that treat or prevent blood clots like warfarin, enoxaparin, and dalteparin This list may not describe all possible interactions. Give your health care provider a list of all the medicines, herbs, non-prescription drugs, or dietary supplements you use. Also tell them if you smoke, drink alcohol, or use illegal drugs. Some items may interact with your medicine. What should I watch for while using this medicine? Visit your doctor for checks on your progress. This drug may make you feel generally unwell. This is not uncommon, as chemotherapy can affect healthy cells as well as cancer cells. Report any side effects. Continue your course of treatment even though you feel ill unless your doctor tells you to stop. In some cases, you may be given additional medicines to help with side effects. Follow all directions for their use. Call your doctor or health care professional for advice if you get a fever, chills or sore throat, or other symptoms of a cold or flu. Do not treat yourself. This drug decreases your body's ability to fight infections. Try to avoid being around people who are sick. This medicine may increase your risk to bruise or bleed. Call your doctor or health care professional if you notice any unusual bleeding. Be careful brushing and flossing your teeth or using a toothpick because you may get an infection or bleed more easily. If you have any dental work done, tell your dentist you are receiving this medicine. Avoid taking products that contain aspirin, acetaminophen, ibuprofen, naproxen, or ketoprofen unless instructed by your doctor. These medicines may hide a fever. Do not become pregnant while taking this medicine. Women should inform their doctor if they wish to become pregnant or think they might be pregnant. There is a potential for  serious side effects to an unborn child. Talk to your health care professional or pharmacist for more information. Do not breast-feed an infant while taking this medicine. Men should inform their doctor if they wish to father a child. This medicine may lower sperm counts. Do not treat diarrhea with over the counter products. Contact your doctor if you have diarrhea that lasts more than 2 days or if it is severe and watery. This medicine can  make you more sensitive to the sun. Keep out of the sun. If you cannot avoid being in the sun, wear protective clothing and use sunscreen. Do not use sun lamps or tanning beds/booths. What side effects may I notice from receiving this medicine? Side effects that you should report to your doctor or health care professional as soon as possible:  allergic reactions like skin rash, itching or hives, swelling of the face, lips, or tongue  low blood counts - this medicine may decrease the number of white blood cells, red blood cells and platelets. You may be at increased risk for infections and bleeding.  signs of infection - fever or chills, cough, sore throat, pain or difficulty passing urine  signs of decreased platelets or bleeding - bruising, pinpoint red spots on the skin, black, tarry stools, blood in the urine  signs of decreased red blood cells - unusually weak or tired, fainting spells, lightheadedness  breathing problems  changes in vision  chest pain  mouth sores  nausea and vomiting  pain, swelling, redness at site where injected  pain, tingling, numbness in the hands or feet  redness, swelling, or sores on hands or feet  stomach pain  unusual bleeding Side effects that usually do not require medical attention (report to your doctor or health care professional if they continue or are bothersome):  changes in finger or toe nails  diarrhea  dry or itchy skin  hair loss  headache  loss of appetite  sensitivity of eyes to the  light  stomach upset  unusually teary eyes This list may not describe all possible side effects. Call your doctor for medical advice about side effects. You may report side effects to FDA at 1-800-FDA-1088. Where should I keep my medicine? This drug is given in a hospital or clinic and will not be stored at home. NOTE: This sheet is a summary. It may not cover all possible information. If you have questions about this medicine, talk to your doctor, pharmacist, or health care provider.  2021 Elsevier/Gold Standard (2019-03-26 15:00:03)

## 2020-09-09 ENCOUNTER — Telehealth: Payer: Self-pay

## 2020-09-09 LAB — CEA: CEA: 0.7 ng/mL (ref 0.0–4.7)

## 2020-09-09 NOTE — Telephone Encounter (Signed)
Telephone call to patient for follow up after receiving first infusion.   No answer but left message stating we were calling to check on them.  Encouraged patient to call for any questions or concerns.   

## 2020-09-10 ENCOUNTER — Encounter: Payer: Self-pay | Admitting: Licensed Clinical Social Worker

## 2020-09-10 ENCOUNTER — Inpatient Hospital Stay (HOSPITAL_BASED_OUTPATIENT_CLINIC_OR_DEPARTMENT_OTHER): Payer: BC Managed Care – PPO | Admitting: Licensed Clinical Social Worker

## 2020-09-10 ENCOUNTER — Inpatient Hospital Stay: Payer: BC Managed Care – PPO

## 2020-09-10 VITALS — BP 128/83 | HR 64 | Temp 96.4°F | Resp 20

## 2020-09-10 DIAGNOSIS — Z8051 Family history of malignant neoplasm of kidney: Secondary | ICD-10-CM

## 2020-09-10 DIAGNOSIS — Z8042 Family history of malignant neoplasm of prostate: Secondary | ICD-10-CM

## 2020-09-10 DIAGNOSIS — C187 Malignant neoplasm of sigmoid colon: Secondary | ICD-10-CM | POA: Diagnosis not present

## 2020-09-10 DIAGNOSIS — C189 Malignant neoplasm of colon, unspecified: Secondary | ICD-10-CM | POA: Diagnosis not present

## 2020-09-10 DIAGNOSIS — Z806 Family history of leukemia: Secondary | ICD-10-CM | POA: Diagnosis not present

## 2020-09-10 MED ORDER — HEPARIN SOD (PORK) LOCK FLUSH 100 UNIT/ML IV SOLN
INTRAVENOUS | Status: AC
  Filled 2020-09-10: qty 5

## 2020-09-10 MED ORDER — HEPARIN SOD (PORK) LOCK FLUSH 100 UNIT/ML IV SOLN
500.0000 [IU] | Freq: Once | INTRAVENOUS | Status: AC | PRN
  Administered 2020-09-10: 500 [IU]
  Filled 2020-09-10: qty 5

## 2020-09-10 MED ORDER — SODIUM CHLORIDE 0.9% FLUSH
10.0000 mL | INTRAVENOUS | Status: DC | PRN
  Administered 2020-09-10: 10 mL
  Filled 2020-09-10: qty 10

## 2020-09-10 NOTE — Progress Notes (Signed)
REFERRING PROVIDER: Lloyd Huger, MD [address] [place],  [place] [address]  PRIMARY PROVIDER:  Kirk Ruths, MD  PRIMARY REASON FOR VISIT:  1. Malignant neoplasm of colon, unspecified part of colon ([place])   2. Family history of kidney cancer   3. Family history of prostate cancer   4. Family history of leukemia      HISTORY OF PRESENT ILLNESS:   Sherri Howell, a 50 y.o. female, was seen for a [place] cancer genetics consultation at the request of Dr. Grayland Howell due to a personal and family history of cancer.  Sherri Howell presents to clinic today to discuss the possibility of a hereditary predisposition to cancer, genetic testing, and to further clarify her future cancer risks, as well as potential cancer risks for family members.   In 2022, at the age of 22, Sherri Howell was diagnosed with colorectal cancer.  She had surgery in March 2022 at [facility]. She is currently being treated with chemotherapy.   CANCER HISTORY:  Oncology History  Colon cancer ([place])  08/15/2020 Initial Diagnosis   Colon cancer ([place])   08/15/2020 Cancer Staging   Staging form: Colon and Rectum, AJCC 8th Edition - Clinical stage from 08/15/2020: Stage IVA (cTX, cNX, pM1a) - Signed by Sherri Huger, MD on 08/15/2020   09/08/2020 -  Chemotherapy    Patient is on Treatment Plan: COLORECTAL FOLFOX Q14D X 6 MONTHS         RISK FACTORS:  Menarche was at age 43.  First live birth at age 51.  Ovaries intact: yes.  Hysterectomy: no..  Menopausal status: premenopausal.  HRT use: 0 years. Mammogram within the last year: yes. Number of breast biopsies: 0.   Past Medical History:  Diagnosis Date  . Colon cancer ([place]) 08/15/2020  . Family history of kidney cancer   . Family history of leukemia   . Family history of prostate cancer     No past surgical history on file.  Social History   Socioeconomic History  . Marital status: Married    Spouse name: Not on file  . Number of children: Not on  file  . Years of education: Not on file  . Highest education level: Not on file  Occupational History  . Not on file  Tobacco Use  . Smoking status: Not on file  . Smokeless tobacco: Not on file  Substance and Sexual Activity  . Alcohol use: Never  . Drug use: Never  . Sexual activity: Yes    Partners: Male  Other Topics Concern  . Not on file  Social History Narrative  . Not on file   Social Determinants of Health   Financial Resource Strain: Not on file  Food Insecurity: Not on file  Transportation Needs: Not on file  Physical Activity: Not on file  Stress: Not on file  Social Connections: Not on file     FAMILY HISTORY:  We obtained a detailed, 4-generation family history.  Significant diagnoses are listed below: Family History  Problem Relation Age of Onset  . Breast cancer Cousin        pat cousin  . Prostate cancer Mother 77  . Kidney cancer Maternal Uncle 52  . Leukemia Maternal Grandfather 57    Sherri Howell has 2 living sons, Sherri Howell, 82, and Sherri Howell, [place]. Her third son Sherri Howell passed shortly after birth due to an aorta condition. Sherri Howell had 2 brothers and 1 sister, no cancers.  Sherri Howell mother is living  at 79, patient has 2 maternal uncles. One had kidney cancer at 51. No cousins with cancer. Maternal grandfather had leukemia at 72 and died at 70. Grandmother passed at 32.  Sherri Howell father died at 4, he had history of prostate cancer at 67. Paternal grandmother passed at 32, grandfather passed at 85.  Sherri Howell is unaware of previous family history of genetic testing for hereditary cancer risks. Patient's maternal ancestors are of [place] descent, and paternal ancestors are of [place] descent. There is no reported Ashkenazi Jewish ancestry. There is no known consanguinity.    GENETIC COUNSELING ASSESSMENT: Sherri Howell is a 50 y.o. female with a personal history of colon cancer at 48 which is somewhat suggestive of a hereditary cancer syndrome and predisposition  to cancer. We, therefore, discussed and recommended the following at today's visit.   DISCUSSION: We discussed that approximately 5-10% of colon cancer is hereditary  Most cases of hereditary colon cancer are associated with Lynch syndrome genes, although there are other genes associated with hereditary colon cancer as well as other cancers seen in her family such as kidney, prostate and leukemia .  We discussed that testing is beneficial for several reasons including  knowing about other cancer risks, identifying potential screening and risk-reduction options that may be appropriate, and to understand if other family members could be at risk for cancer and allow them to undergo genetic testing.   We reviewed the characteristics, features and inheritance patterns of hereditary cancer syndromes. We also discussed genetic testing, including the appropriate family members to test, the process of testing, insurance coverage and turn-around-time for results. We discussed the implications of a negative, positive and/or variant of uncertain significant result. We recommended Sherri Howell pursue genetic testing for the [facility] CancerNext-Expanded+RNA gene panel.   The CancerNext-Expanded + RNAinsight gene panel offered by [employer] and includes sequencing and rearrangement analysis for the following 77 genes: IP, ALK, APC*, ATM*, AXIN2, BAP1, BARD1, BLM, BMPR1A, BRCA1*, BRCA2*, BRIP1*, CDC73, CDH1*,CDK4, CDKN1B, CDKN2A, CHEK2*, CTNNA1, DICER1, FANCC, FH, FLCN, GALNT12, KIF1B, LZTR1, MAX, MEN1, MET, MLH1*, MSH2*, MSH3, MSH6*, MUTYH*, NBN, NF1*, NF2, NTHL1, PALB2*, PHOX2B, PMS2*, POT1, PRKAR1A, PTCH1, PTEN*, RAD51C*, RAD51D*,RB1, RECQL, RET, SDHA, SDHAF2, SDHB, SDHC, SDHD, SMAD4, SMARCA4, SMARCB1, SMARCE1, STK11, SUFU, TMEM127, TP53*,TSC1, TSC2, VHL and XRCC2 (sequencing and deletion/duplication); EGFR, EGLN1, HOXB13, KIT, MITF, PDGFRA, POLD1 and POLE (sequencing only); EPCAM and GREM1 (deletion/duplication  only).  Based on [name]'s personal and family history of cancer, she meets medical criteria for genetic testing. Despite that she meets criteria, she may still have an out of pocket cost. We discussed that if her out of pocket cost for testing is over $100, the laboratory will call and confirm whether she wants to proceed with testing.  If the out of pocket cost of testing is less than $100 she will be billed by the genetic testing laboratory.   PLAN: After considering the risks, benefits, and limitations, Sherri Howell provided informed consent to pursue genetic testing and the blood sample was sent to [facility] for analysis of the CancerNext-Expanded+RNA. Results should be available within approximately 2-3 weeks' time, at which point they will be disclosed by telephone to Sherri Howell, as will any additional recommendations warranted by these results. Sherri Howell will receive a summary of her genetic counseling visit and a copy of her results once available. This information will also be available in Epic.   Sherri Howell questions were answered to her satisfaction today. Our contact information was provided  should additional questions or concerns arise. Thank you for the referral and allowing [place] to share in the care of your patient.   Faith Rogue, MS, [facility] Genetic Counselor [email or web address] Phone: [phone]  The patient was seen for a total of 30 minutes in face-to-face genetic counseling. Patient's husband Sherri Howell was also present.  Dr. Grayland Howell was available for discussion regarding this case.   _______________________________________________________________________ For Office Staff:  Number of people involved in session: 2 Was an Intern/ student involved with case: no

## 2020-09-11 NOTE — Progress Notes (Signed)
[place]  Telephone:(336) [phone] Fax:(336) [phone]  ID: Sherri Howell OB: 05-24-1970  MR#: [id number]  ZDG#:[id number]  Patient Care Team: Kirk Ruths, MD as PCP - General (Internal Medicine)    I connected with Sherri Howell on 09/15/20 at  3:00 PM EDT by video enabled telemedicine visit and verified that I am speaking with the correct person using two identifiers.   I discussed the limitations, risks, security and privacy concerns of performing an evaluation and management service by telemedicine and the availability of in-person appointments. I also discussed with the patient that there may be a patient responsible charge related to this service. The patient expressed understanding and agreed to proceed.   Other persons participating in the visit and their role in the encounter: Patient, MD.  Patient's location: Home. Provider's location: Clinic.  CHIEF COMPLAINT: Colon cancer metastasized to liver.  INTERVAL HISTORY: Patient agreed to video assisted telemedicine visit for further evaluation and to assess her toleration of cycle 1 of FOLFOX.  She had increased fatigue and cold neuropathy, but otherwise tolerated her treatment well.  She has no other neurologic complaints.  She denies any recent fevers or illnesses.  She has a fair appetite and denies weight loss.  She has no chest pain, shortness of breath, cough, or hemoptysis.  She denies any nausea, vomiting, constipation, or diarrhea.  She has no further melena or hematochezia.  She has no urinary complaints.  Patient offers no further specific complaints today.  REVIEW OF SYSTEMS:   Review of Systems  Constitutional: Negative.  Negative for fever, malaise/fatigue and weight loss.  Respiratory: Negative.  Negative for cough, hemoptysis and shortness of breath.   Cardiovascular: Negative.  Negative for chest pain and leg swelling.  Gastrointestinal: Negative.  Negative for abdominal pain, blood in  stool and melena.  Genitourinary: Negative.  Negative for dysuria.  Musculoskeletal: Negative.  Negative for back pain.  Skin: Negative.  Negative for rash.  Neurological: Negative.  Negative for dizziness, seizures, weakness and headaches.  Psychiatric/Behavioral: Negative.  The patient is not nervous/anxious.     As per HPI. Otherwise, a complete review of systems is negative.  PAST MEDICAL HISTORY: Past Medical History:  Diagnosis Date  . Colon cancer ([place]) 08/15/2020  . Family history of kidney cancer   . Family history of leukemia   . Family history of prostate cancer     PAST SURGICAL HISTORY: No past surgical history on file.  FAMILY HISTORY: Family History  Problem Relation Age of Onset  . Breast cancer Cousin        pat cousin  . Prostate cancer Mother 12  . Kidney cancer Maternal Uncle 52  . Leukemia Maternal Grandfather 49    ADVANCED DIRECTIVES (Y/N):  N  HEALTH MAINTENANCE: Social History   Substance Use Topics  . Alcohol use: Never  . Drug use: Never     Colonoscopy:  PAP:  Bone density:  Lipid panel:  No Known Allergies  Current Outpatient Medications  Medication Sig Dispense Refill  . cetirizine (ZYRTEC) 10 MG tablet Take 1 tablet by mouth daily.    . cholecalciferol (VITAMIN D3) 25 MCG (1000 UNIT) tablet Take 1,000 Units by mouth daily.    Marland Kitchen docusate sodium (COLACE) 100 MG capsule Take 100 mg by mouth 2 (two) times daily.    Marland Kitchen lidocaine-prilocaine (EMLA) cream Apply 1 application topically as needed. 30 g 3  . Multiple Vitamin (MULTIVITAMIN) tablet Take 1 tablet by mouth daily.    Marland Kitchen  Omega-3 Fatty Acids (FISH OIL) 1000 MG CAPS Take 1 capsule by mouth daily.    . ondansetron (ZOFRAN) 8 MG tablet Take 1 tablet (8 mg total) by mouth 2 (two) times daily as needed for refractory nausea / vomiting. 60 tablet 1  . polyethylene glycol (MIRALAX / GLYCOLAX) 17 g packet Take 17 g by mouth daily.    . prochlorperazine (COMPAZINE) 10 MG tablet Take 1 tablet  (10 mg total) by mouth every 6 (six) hours as needed (Nausea or vomiting). 60 tablet 1   No current facility-administered medications for this visit.    OBJECTIVE: There were no vitals filed for this visit.   There is no height or weight on file to calculate BMI.    ECOG FS:0 - Asymptomatic  General: Well-developed, well-nourished, no acute distress. HEENT: Normocephalic. Neuro: Alert, answering all questions appropriately. Cranial nerves grossly intact. Psych: Normal affect.  LAB RESULTS:  Lab Results  Component Value Date   NA 136 09/15/2020   K 3.9 09/15/2020   CL 101 09/15/2020   CO2 25 09/15/2020   GLUCOSE 113 (H) 09/15/2020   BUN 19 09/15/2020   CREATININE 1.02 (H) 09/15/2020   CALCIUM 11.4 (H) 09/15/2020   PROT 7.5 09/15/2020   ALBUMIN 4.1 09/15/2020   AST 20 09/15/2020   ALT 15 09/15/2020   ALKPHOS 57 09/15/2020   BILITOT 0.8 09/15/2020   GFRNONAA >60 09/15/2020    Lab Results  Component Value Date   WBC 6.4 09/15/2020   NEUTROABS 2.9 09/15/2020   HGB 15.3 (H) 09/15/2020   HCT 46.0 09/15/2020   MCV 93.3 09/15/2020   PLT 325 09/15/2020     STUDIES: No results found.  ASSESSMENT: Colon cancer metastasized to liver.  PLAN:    1. Colon cancer metastasized to liver: Patient initially presented to [facility] on on July 28, 2020 and underwent surgical intervention for metastatic colon cancer to the liver.  She was taken to the operating room by Cameron Sprang MD and Binnie Rail MD who performed a robot assisted laparoscopy, robot-assisted partial hepatectomy (Segment 4A/8), microwave ablation (x2, segments 7, 7/8), intraoperative ultrasound and robotic low anterior resection. Port has been placed.  Patient tolerated cycle 1 of treatment last week without significant side effects.  Return to clinic in 1 week for further evaluation and consideration of cycle 2.   2.  Genetics referral: Pending at time of dictation. 3.  Polycythemia: Likely  some mild dehydration and hemoconcentration, monitor.   I provided 20 minutes of face-to-face video visit time during this encounter which included chart review, counseling, and coordination of care as documented above.  Patient expressed understanding and was in agreement with this plan. She also understands that She can call clinic at any time with any questions, concerns, or complaints.   Cancer Staging Colon cancer [facility]) Staging form: Colon and Rectum, AJCC 8th Edition - Clinical stage from 08/15/2020: Stage IVA (cTX, cNX, pM1a) - Signed by Lloyd Huger, MD on 08/15/2020   Lloyd Huger, MD   09/15/2020 4:29 PM

## 2020-09-15 ENCOUNTER — Inpatient Hospital Stay (HOSPITAL_BASED_OUTPATIENT_CLINIC_OR_DEPARTMENT_OTHER): Payer: BC Managed Care – PPO | Admitting: Oncology

## 2020-09-15 ENCOUNTER — Inpatient Hospital Stay: Payer: BC Managed Care – PPO

## 2020-09-15 DIAGNOSIS — C187 Malignant neoplasm of sigmoid colon: Secondary | ICD-10-CM | POA: Diagnosis not present

## 2020-09-15 DIAGNOSIS — C189 Malignant neoplasm of colon, unspecified: Secondary | ICD-10-CM

## 2020-09-15 LAB — COMPREHENSIVE METABOLIC PANEL
ALT: 15 U/L (ref 0–44)
AST: 20 U/L (ref 15–41)
Albumin: 4.1 g/dL (ref 3.5–5.0)
Alkaline Phosphatase: 57 U/L (ref 38–126)
Anion gap: 10 (ref 5–15)
BUN: 19 mg/dL (ref 6–20)
CO2: 25 mmol/L (ref 22–32)
Calcium: 11.4 mg/dL — ABNORMAL HIGH (ref 8.9–10.3)
Chloride: 101 mmol/L (ref 98–111)
Creatinine, Ser: 1.02 mg/dL — ABNORMAL HIGH (ref 0.44–1.00)
GFR, Estimated: >60 mL/min (ref >60)
Glucose, Bld: 113 mg/dL — ABNORMAL HIGH (ref 70–99)
Potassium: 3.9 mmol/L (ref 3.5–5.1)
Sodium: 136 mmol/L (ref 135–145)
Total Bilirubin: 0.8 mg/dL (ref 0.3–1.2)
Total Protein: 7.5 g/dL (ref 6.5–8.1)

## 2020-09-15 LAB — CBC WITH DIFFERENTIAL/PLATELET
Abs Immature Granulocytes: 0.01 10*3/uL (ref 0.00–0.07)
Basophils Absolute: 0 10*3/uL (ref 0.0–0.1)
Basophils Relative: 1 %
Eosinophils Absolute: 1 10*3/uL — ABNORMAL HIGH (ref 0.0–0.5)
Eosinophils Relative: 15 %
HCT: 46 % (ref 36.0–46.0)
Hemoglobin: 15.3 g/dL — ABNORMAL HIGH (ref 12.0–15.0)
Immature Granulocytes: 0 %
Lymphocytes Relative: 33 %
Lymphs Abs: 2.1 10*3/uL (ref 0.7–4.0)
MCH: 31 pg (ref 26.0–34.0)
MCHC: 33.3 g/dL (ref 30.0–36.0)
MCV: 93.3 fL (ref 80.0–100.0)
Monocytes Absolute: 0.4 10*3/uL (ref 0.1–1.0)
Monocytes Relative: 6 %
Neutro Abs: 2.9 10*3/uL (ref 1.7–7.7)
Neutrophils Relative %: 45 %
Platelets: 325 10*3/uL (ref 150–400)
RBC: 4.93 MIL/uL (ref 3.87–5.11)
RDW: 12.6 % (ref 11.5–15.5)
WBC: 6.4 10*3/uL (ref 4.0–10.5)
nRBC: 0 % (ref 0.0–0.2)

## 2020-09-15 NOTE — Progress Notes (Signed)
Patient denies any concerns today.  

## 2020-09-17 NOTE — Progress Notes (Signed)
[place]  Telephone:(336) [phone] Fax:(336) [phone]  ID: Sherri Howell OB: [birth year]  MR#: [id number]  XBM#:[id number]  Patient Care Team: Kirk Ruths, MD as PCP - General (Internal Medicine)    CHIEF COMPLAINT: Colon cancer metastasized to liver.  INTERVAL HISTORY: Patient returns to clinic today for further evaluation and consideration of cycle 2 of FOLFOX.  Her fatigue and cold neuropathy have resolved.  She currently feels well and is asymptomatic.  She has no neurologic complaints. She denies any recent fevers or illnesses.  She has a fair appetite and denies weight loss.  She has no chest pain, shortness of breath, cough, or hemoptysis.  She denies any nausea, vomiting, constipation, or diarrhea.  She has no further melena or hematochezia.  She has no urinary complaints.  Patient offers no specific complaints today.  REVIEW OF SYSTEMS:   Review of Systems  Constitutional: Negative.  Negative for fever, malaise/fatigue and weight loss.  Respiratory: Negative.  Negative for cough, hemoptysis and shortness of breath.   Cardiovascular: Negative.  Negative for chest pain and leg swelling.  Gastrointestinal: Negative.  Negative for abdominal pain, blood in stool and melena.  Genitourinary: Negative.  Negative for dysuria.  Musculoskeletal: Negative.  Negative for back pain.  Skin: Negative.  Negative for rash.  Neurological: Negative.  Negative for dizziness, seizures, weakness and headaches.  Psychiatric/Behavioral: Negative.  The patient is not nervous/anxious.     As per HPI. Otherwise, a complete review of systems is negative.  PAST MEDICAL HISTORY: Past Medical History:  Diagnosis Date  . Colon cancer ([place]) 08/15/2020  . Family history of kidney cancer   . Family history of leukemia   . Family history of prostate cancer     PAST SURGICAL HISTORY: No past surgical history on file.  FAMILY HISTORY: Family History  Problem Relation Age of  Onset  . Breast cancer Cousin        pat cousin  . Prostate cancer Mother 72  . Kidney cancer Maternal Uncle 52  . Leukemia Maternal Grandfather 11    ADVANCED DIRECTIVES (Y/N):  N  HEALTH MAINTENANCE: Social History   Tobacco Use  . Smoking status: Never Smoker  . Smokeless tobacco: Never Used  Substance Use Topics  . Alcohol use: Never  . Drug use: Never     Colonoscopy:  PAP:  Bone density:  Lipid panel:  No Known Allergies  Current Outpatient Medications  Medication Sig Dispense Refill  . cetirizine (ZYRTEC) 10 MG tablet Take 1 tablet by mouth daily.    . cholecalciferol (VITAMIN D3) 25 MCG (1000 UNIT) tablet Take 1,000 Units by mouth daily.    Marland Kitchen docusate sodium (COLACE) 100 MG capsule Take 100 mg by mouth 2 (two) times daily.    Marland Kitchen lidocaine-prilocaine (EMLA) cream Apply 1 application topically as needed. 30 g 3  . Multiple Vitamin (MULTIVITAMIN) tablet Take 1 tablet by mouth daily.    . Omega-3 Fatty Acids (FISH OIL) 1000 MG CAPS Take 1 capsule by mouth daily.    . ondansetron (ZOFRAN) 8 MG tablet Take 1 tablet (8 mg total) by mouth 2 (two) times daily as needed for refractory nausea / vomiting. 60 tablet 1  . polyethylene glycol (MIRALAX / GLYCOLAX) 17 g packet Take 17 g by mouth daily.    . prochlorperazine (COMPAZINE) 10 MG tablet Take 1 tablet (10 mg total) by mouth every 6 (six) hours as needed (Nausea or vomiting). 60 tablet 1   No current facility-administered medications  for this visit.   Facility-Administered Medications Ordered in Other Visits  Medication Dose Route Frequency Provider Last Rate Last Admin  . dexamethasone (DECADRON) 10 mg in sodium chloride 0.9 % 50 mL IVPB  10 mg Intravenous Once Lloyd Huger, MD      . dextrose 5 % solution   Intravenous Once Lloyd Huger, MD      . fluorouracil (ADRUCIL) 5,800 mg in sodium chloride 0.9 % 134 mL chemo infusion  2,400 mg/m2 (Treatment Plan Recorded) Intravenous 1 day or 1 dose Lloyd Huger, MD      . fluorouracil (ADRUCIL) chemo injection 950 mg  400 mg/m2 (Treatment Plan Recorded) Intravenous Once Lloyd Huger, MD      . heparin lock flush 100 unit/mL  500 Units Intracatheter Once PRN Lloyd Huger, MD      . leucovorin 950 mg in dextrose 5 % 250 mL infusion  950 mg Intravenous Once Lloyd Huger, MD      . oxaliplatin (ELOXATIN) 200 mg in dextrose 5 % 500 mL chemo infusion  200 mg Intravenous Once Lloyd Huger, MD      . palonosetron (ALOXI) injection 0.25 mg  0.25 mg Intravenous Once Lloyd Huger, MD        OBJECTIVE: Vitals:   09/22/20 0925  BP: (!) 150/102  Pulse: 76  Resp: 17  Temp: 98.7 F (37.1 C)  SpO2: 100%     Body mass index is 39.28 kg/m.    ECOG FS:0 - Asymptomatic  General: Well-developed, well-nourished, no acute distress. Eyes: Pink conjunctiva, anicteric sclera. HEENT: Normocephalic, moist mucous membranes. Lungs: No audible wheezing or coughing. Heart: Regular rate and rhythm. Abdomen: Soft, nontender, no obvious distention. Musculoskeletal: No edema, cyanosis, or clubbing. Neuro: Alert, answering all questions appropriately. Cranial nerves grossly intact. Skin: No rashes or petechiae noted. Psych: Normal affect.  LAB RESULTS:  Lab Results  Component Value Date   NA 137 09/22/2020   K 3.7 09/22/2020   CL 104 09/22/2020   CO2 25 09/22/2020   GLUCOSE 127 (H) 09/22/2020   BUN 16 09/22/2020   CREATININE 0.83 09/22/2020   CALCIUM 11.0 (H) 09/22/2020   PROT 7.0 09/22/2020   ALBUMIN 3.7 09/22/2020   AST 21 09/22/2020   ALT 14 09/22/2020   ALKPHOS 65 09/22/2020   BILITOT 0.7 09/22/2020   GFRNONAA >60 09/22/2020    Lab Results  Component Value Date   WBC 5.2 09/22/2020   NEUTROABS 2.9 09/22/2020   HGB 13.8 09/22/2020   HCT 41.0 09/22/2020   MCV 93.4 09/22/2020   PLT 308 09/22/2020     STUDIES: No results found.  ASSESSMENT: Colon cancer metastasized to liver.  PLAN:    1. Colon  cancer metastasized to liver: Patient initially presented to [facility] on on July 28, 2020 and underwent surgical intervention for metastatic colon cancer to the liver.  She was taken to the operating room by Cameron Sprang MD and Binnie Rail MD who performed a robot assisted laparoscopy, robot-assisted partial hepatectomy (Segment 4A/8), microwave ablation (x2, segments 7, 7/8), intraoperative ultrasound and robotic low anterior resection. Port has been placed.  Proceed with cycle 2 of 12 of adjuvant FOLFOX today.  Return to clinic in 2 days for pump removal and then in 2 weeks for further evaluation and consideration of cycle 3.  2.  Genetics referral: Negative. 3.  Polycythemia: Resolved.  I spent a total of 30 minutes reviewing chart data, face-to-face  evaluation with the patient, counseling and coordination of care as detailed above.   Patient expressed understanding and was in agreement with this plan. She also understands that She can call clinic at any time with any questions, concerns, or complaints.   Cancer Staging Colon cancer [facility]) Staging form: Colon and Rectum, AJCC 8th Edition - Clinical stage from 08/15/2020: Stage IVA (cTX, cNX, pM1a) - Signed by Lloyd Huger, MD on 08/15/2020   Lloyd Huger, MD   09/22/2020 10:17 AM

## 2020-09-22 ENCOUNTER — Inpatient Hospital Stay (HOSPITAL_BASED_OUTPATIENT_CLINIC_OR_DEPARTMENT_OTHER): Payer: BC Managed Care – PPO | Admitting: Oncology

## 2020-09-22 ENCOUNTER — Other Ambulatory Visit: Payer: Self-pay

## 2020-09-22 ENCOUNTER — Inpatient Hospital Stay: Payer: BC Managed Care – PPO

## 2020-09-22 ENCOUNTER — Encounter: Payer: Self-pay | Admitting: Oncology

## 2020-09-22 VITALS — BP 150/102 | HR 76 | Temp 98.7°F | Resp 17 | Ht 69.0 in | Wt 266.0 lb

## 2020-09-22 DIAGNOSIS — C189 Malignant neoplasm of colon, unspecified: Secondary | ICD-10-CM | POA: Diagnosis not present

## 2020-09-22 DIAGNOSIS — C187 Malignant neoplasm of sigmoid colon: Secondary | ICD-10-CM | POA: Diagnosis not present

## 2020-09-22 LAB — COMPREHENSIVE METABOLIC PANEL
ALT: 14 U/L (ref 0–44)
AST: 21 U/L (ref 15–41)
Albumin: 3.7 g/dL (ref 3.5–5.0)
Alkaline Phosphatase: 65 U/L (ref 38–126)
Anion gap: 8 (ref 5–15)
BUN: 16 mg/dL (ref 6–20)
CO2: 25 mmol/L (ref 22–32)
Calcium: 11 mg/dL — ABNORMAL HIGH (ref 8.9–10.3)
Chloride: 104 mmol/L (ref 98–111)
Creatinine, Ser: 0.83 mg/dL (ref 0.44–1.00)
GFR, Estimated: >60 mL/min (ref >60)
Glucose, Bld: 127 mg/dL — ABNORMAL HIGH (ref 70–99)
Potassium: 3.7 mmol/L (ref 3.5–5.1)
Sodium: 137 mmol/L (ref 135–145)
Total Bilirubin: 0.7 mg/dL (ref 0.3–1.2)
Total Protein: 7 g/dL (ref 6.5–8.1)

## 2020-09-22 LAB — CBC WITH DIFFERENTIAL/PLATELET
Abs Immature Granulocytes: 0.01 10*3/uL (ref 0.00–0.07)
Basophils Absolute: 0 10*3/uL (ref 0.0–0.1)
Basophils Relative: 1 %
Eosinophils Absolute: 0.3 10*3/uL (ref 0.0–0.5)
Eosinophils Relative: 5 %
HCT: 41 % (ref 36.0–46.0)
Hemoglobin: 13.8 g/dL (ref 12.0–15.0)
Immature Granulocytes: 0 %
Lymphocytes Relative: 27 %
Lymphs Abs: 1.4 10*3/uL (ref 0.7–4.0)
MCH: 31.4 pg (ref 26.0–34.0)
MCHC: 33.7 g/dL (ref 30.0–36.0)
MCV: 93.4 fL (ref 80.0–100.0)
Monocytes Absolute: 0.7 10*3/uL (ref 0.1–1.0)
Monocytes Relative: 13 %
Neutro Abs: 2.9 10*3/uL (ref 1.7–7.7)
Neutrophils Relative %: 54 %
Platelets: 308 10*3/uL (ref 150–400)
RBC: 4.39 MIL/uL (ref 3.87–5.11)
RDW: 13 % (ref 11.5–15.5)
WBC: 5.2 10*3/uL (ref 4.0–10.5)
nRBC: 0 % (ref 0.0–0.2)

## 2020-09-22 MED ORDER — FLUOROURACIL CHEMO INJECTION 2.5 GM/50ML
400.0000 mg/m2 | Freq: Once | INTRAVENOUS | Status: AC
  Administered 2020-09-22: 950 mg via INTRAVENOUS
  Filled 2020-09-22: qty 19

## 2020-09-22 MED ORDER — OXALIPLATIN CHEMO INJECTION 100 MG/20ML
200.0000 mg | Freq: Once | INTRAVENOUS | Status: AC
  Administered 2020-09-22: 200 mg via INTRAVENOUS
  Filled 2020-09-22: qty 40

## 2020-09-22 MED ORDER — SODIUM CHLORIDE 0.9 % IV SOLN
2400.0000 mg/m2 | INTRAVENOUS | Status: DC
  Administered 2020-09-22: 5800 mg via INTRAVENOUS
  Filled 2020-09-22: qty 116

## 2020-09-22 MED ORDER — HEPARIN SOD (PORK) LOCK FLUSH 100 UNIT/ML IV SOLN
500.0000 [IU] | Freq: Once | INTRAVENOUS | Status: DC | PRN
  Filled 2020-09-22: qty 5

## 2020-09-22 MED ORDER — PALONOSETRON HCL INJECTION 0.25 MG/5ML
0.2500 mg | Freq: Once | INTRAVENOUS | Status: AC
  Administered 2020-09-22: 0.25 mg via INTRAVENOUS
  Filled 2020-09-22: qty 5

## 2020-09-22 MED ORDER — DEXTROSE 5 % IV SOLN
Freq: Once | INTRAVENOUS | Status: AC
  Filled 2020-09-22: qty 250

## 2020-09-22 MED ORDER — SODIUM CHLORIDE 0.9 % IV SOLN
10.0000 mg | Freq: Once | INTRAVENOUS | Status: AC
  Administered 2020-09-22: 10 mg via INTRAVENOUS
  Filled 2020-09-22: qty 10

## 2020-09-22 MED ORDER — LEUCOVORIN CALCIUM INJECTION 350 MG
950.0000 mg | Freq: Once | INTRAVENOUS | Status: AC
  Administered 2020-09-22: 950 mg via INTRAVENOUS
  Filled 2020-09-22: qty 47.5

## 2020-09-22 NOTE — Patient Instructions (Signed)
Fluorouracil, 5-FU injection What is this medicine? FLUOROURACIL, 5-FU (flure oh YOOR a sil) is a chemotherapy drug. It slows the growth of cancer cells. This medicine is used to treat many types of cancer like breast cancer, colon or rectal cancer, pancreatic cancer, and stomach cancer. This medicine may be used for other purposes; ask your health care provider or pharmacist if you have questions. COMMON BRAND NAME(S): Adrucil What should I tell my health care provider before I take this medicine? They need to know if you have any of these conditions:  blood disorders  dihydropyrimidine dehydrogenase (DPD) deficiency  infection (especially a virus infection such as chickenpox, cold sores, or herpes)  kidney disease  liver disease  malnourished, poor nutrition  recent or ongoing radiation therapy  an unusual or allergic reaction to fluorouracil, other chemotherapy, other medicines, foods, dyes, or preservatives  pregnant or trying to get pregnant  breast-feeding How should I use this medicine? This drug is given as an infusion or injection into a vein. It is administered in a hospital or clinic by a specially trained health care professional. Talk to your pediatrician regarding the use of this medicine in children. Special care may be needed. Overdosage: If you think you have taken too much of this medicine contact a poison control center or emergency room at once. NOTE: This medicine is only for you. Do not share this medicine with others. What if I miss a dose? It is important not to miss your dose. Call your doctor or health care professional if you are unable to keep an appointment. What may interact with this medicine? Do not take this medicine with any of the following medications:  live virus vaccines This medicine may also interact with the following medications:  medicines that treat or prevent blood clots like warfarin, enoxaparin, and dalteparin This list may not  describe all possible interactions. Give your health care provider a list of all the medicines, herbs, non-prescription drugs, or dietary supplements you use. Also tell them if you smoke, drink alcohol, or use illegal drugs. Some items may interact with your medicine. What should I watch for while using this medicine? Visit your doctor for checks on your progress. This drug may make you feel generally unwell. This is not uncommon, as chemotherapy can affect healthy cells as well as cancer cells. Report any side effects. Continue your course of treatment even though you feel ill unless your doctor tells you to stop. In some cases, you may be given additional medicines to help with side effects. Follow all directions for their use. Call your doctor or health care professional for advice if you get a fever, chills or sore throat, or other symptoms of a cold or flu. Do not treat yourself. This drug decreases your body's ability to fight infections. Try to avoid being around people who are sick. This medicine may increase your risk to bruise or bleed. Call your doctor or health care professional if you notice any unusual bleeding. Be careful brushing and flossing your teeth or using a toothpick because you may get an infection or bleed more easily. If you have any dental work done, tell your dentist you are receiving this medicine. Avoid taking products that contain aspirin, acetaminophen, ibuprofen, naproxen, or ketoprofen unless instructed by your doctor. These medicines may hide a fever. Do not become pregnant while taking this medicine. Women should inform their doctor if they wish to become pregnant or think they might be pregnant. There is a potential   for serious side effects to an unborn child. Talk to your health care professional or pharmacist for more information. Do not breast-feed an infant while taking this medicine. Men should inform their doctor if they wish to father a child. This medicine may  lower sperm counts. Do not treat diarrhea with over the counter products. Contact your doctor if you have diarrhea that lasts more than 2 days or if it is severe and watery. This medicine can make you more sensitive to the sun. Keep out of the sun. If you cannot avoid being in the sun, wear protective clothing and use sunscreen. Do not use sun lamps or tanning beds/booths. What side effects may I notice from receiving this medicine? Side effects that you should report to your doctor or health care professional as soon as possible:  allergic reactions like skin rash, itching or hives, swelling of the face, lips, or tongue  low blood counts - this medicine may decrease the number of white blood cells, red blood cells and platelets. You may be at increased risk for infections and bleeding.  signs of infection - fever or chills, cough, sore throat, pain or difficulty passing urine  signs of decreased platelets or bleeding - bruising, pinpoint red spots on the skin, black, tarry stools, blood in the urine  signs of decreased red blood cells - unusually weak or tired, fainting spells, lightheadedness  breathing problems  changes in vision  chest pain  mouth sores  nausea and vomiting  pain, swelling, redness at site where injected  pain, tingling, numbness in the hands or feet  redness, swelling, or sores on hands or feet  stomach pain  unusual bleeding Side effects that usually do not require medical attention (report to your doctor or health care professional if they continue or are bothersome):  changes in finger or toe nails  diarrhea  dry or itchy skin  hair loss  headache  loss of appetite  sensitivity of eyes to the light  stomach upset  unusually teary eyes This list may not describe all possible side effects. Call your doctor for medical advice about side effects. You may report side effects to FDA at 1-800-FDA-1088. Where should I keep my medicine? This  drug is given in a hospital or clinic and will not be stored at home. NOTE: This sheet is a summary. It may not cover all possible information. If you have questions about this medicine, talk to your doctor, pharmacist, or health care provider.  2021 [employer] Standard (2019-03-26 15:00:03)  Leucovorin injection What is this medicine? LEUCOVORIN (loo koe VOR in) is used to prevent or treat the harmful effects of some medicines. This medicine is used to treat anemia caused by a low amount of folic acid in the body. It is also used with 5-fluorouracil (5-FU) to treat colon cancer. This medicine may be used for other purposes; ask your health care provider or pharmacist if you have questions. What should I tell my health care provider before I take this medicine? They need to know if you have any of these conditions:  anemia from low levels of vitamin B-12 in the blood  an unusual or allergic reaction to leucovorin, folic acid, other medicines, foods, dyes, or preservatives  pregnant or trying to get pregnant  breast-feeding How should I use this medicine? This medicine is for injection into a muscle or into a vein. It is given by a health care professional in a hospital or clinic setting. Talk to your  pediatrician regarding the use of this medicine in children. Special care may be needed. Overdosage: If you think you have taken too much of this medicine contact a poison control center or emergency room at once. NOTE: This medicine is only for you. Do not share this medicine with others. What if I miss a dose? This does not apply. What may interact with this medicine?  capecitabine  fluorouracil  phenobarbital  phenytoin  primidone  trimethoprim-sulfamethoxazole This list may not describe all possible interactions. Give your health care provider a list of all the medicines, herbs, non-prescription drugs, or dietary supplements you use. Also tell them if you smoke, drink  alcohol, or use illegal drugs. Some items may interact with your medicine. What should I watch for while using this medicine? Your condition will be monitored carefully while you are receiving this medicine. This medicine may increase the side effects of 5-fluorouracil, 5-FU. Tell your doctor or health care professional if you have diarrhea or mouth sores that do not get better or that get worse. What side effects may I notice from receiving this medicine? Side effects that you should report to your doctor or health care professional as soon as possible:  allergic reactions like skin rash, itching or hives, swelling of the face, lips, or tongue  breathing problems  fever, infection  mouth sores  unusual bleeding or bruising  unusually weak or tired Side effects that usually do not require medical attention (report to your doctor or health care professional if they continue or are bothersome):  constipation or diarrhea  loss of appetite  nausea, vomiting This list may not describe all possible side effects. Call your doctor for medical advice about side effects. You may report side effects to FDA at 1-800-FDA-1088. Where should I keep my medicine? This drug is given in a hospital or clinic and will not be stored at home. NOTE: This sheet is a summary. It may not cover all possible information. If you have questions about this medicine, talk to your doctor, pharmacist, or health care provider.  2021 Else[employer]/Gold Standard (2007-10-30 16:50:29) Oxaliplatin Injection What is this medicine? OXALIPLATIN (ox AL i PLA tin) is a chemotherapy drug. It targets fast dividing cells, like cancer cells, and causes these cells to die. This medicine is used to treat cancers of the colon and rectum, and many other cancers. This medicine may be used for other purposes; ask your health care provider or pharmacist if you have questions. COMMON BRAND NAME(S): Eloxatin What should I tell my health care  provider before I take this medicine? They need to know if you have any of these conditions:  heart disease  history of irregular heartbeat  liver disease  low blood counts, like white cells, platelets, or red blood cells  lung or breathing disease, like asthma  take medicines that treat or prevent blood clots  tingling of the fingers or toes, or other nerve disorder  an unusual or allergic reaction to oxaliplatin, other chemotherapy, other medicines, foods, dyes, or preservatives  pregnant or trying to get pregnant  breast-feeding How should I use this medicine? This drug is given as an infusion into a vein. It is administered in a hospital or clinic by a specially trained health care professional. Talk to your pediatrician regarding the use of this medicine in children. Special care may be needed. Overdosage: If you think you have taken too much of this medicine contact a poison control center or emergency room at once. NOTE: This  medicine is only for you. Do not share this medicine with others. What if I miss a dose? It is important not to miss a dose. Call your doctor or health care professional if you are unable to keep an appointment. What may interact with this medicine? Do not take this medicine with any of the following medications:  cisapride  dronedarone  pimozide  thioridazine This medicine may also interact with the following medications:  aspirin and aspirin-like medicines  certain medicines that treat or prevent blood clots like warfarin, apixaban, dabigatran, and rivaroxaban  cisplatin  cyclosporine  diuretics  medicines for infection like acyclovir, adefovir, amphotericin B, bacitracin, cidofovir, foscarnet, ganciclovir, gentamicin, pentamidine, vancomycin  NSAIDs, medicines for pain and inflammation, like ibuprofen or naproxen  other medicines that prolong the QT interval (an abnormal heart rhythm)  pamidronate  zoledronic acid This list  may not describe all possible interactions. Give your health care provider a list of all the medicines, herbs, non-prescription drugs, or dietary supplements you use. Also tell them if you smoke, drink alcohol, or use illegal drugs. Some items may interact with your medicine. What should I watch for while using this medicine? Your condition will be monitored carefully while you are receiving this medicine. You may need blood work done while you are taking this medicine. This medicine may make you feel generally unwell. This is not uncommon as chemotherapy can affect healthy cells as well as cancer cells. Report any side effects. Continue your course of treatment even though you feel ill unless your healthcare professional tells you to stop. This medicine can make you more sensitive to cold. Do not drink cold drinks or use ice. Cover exposed skin before coming in contact with cold temperatures or cold objects. When out in cold weather wear warm clothing and cover your mouth and nose to warm the air that goes into your lungs. Tell your doctor if you get sensitive to the cold. Do not become pregnant while taking this medicine or for 9 months after stopping it. Women should inform their health care professional if they wish to become pregnant or think they might be pregnant. Men should not father a child while taking this medicine and for 6 months after stopping it. There is potential for serious side effects to an unborn child. Talk to your health care professional for more information. Do not breast-feed a child while taking this medicine or for 3 months after stopping it. This medicine has caused ovarian failure in some women. This medicine may make it more difficult to get pregnant. Talk to your health care professional if you are concerned about your fertility. This medicine has caused decreased sperm counts in some men. This may make it more difficult to father a child. Talk to your health care  professional if you are concerned about your fertility. This medicine may increase your risk of getting an infection. Call your health care professional for advice if you get a fever, chills, or sore throat, or other symptoms of a cold or flu. Do not treat yourself. Try to avoid being around people who are sick. Avoid taking medicines that contain aspirin, acetaminophen, ibuprofen, naproxen, or ketoprofen unless instructed by your health care professional. These medicines may hide a fever. Be careful brushing or flossing your teeth or using a toothpick because you may get an infection or bleed more easily. If you have any dental work done, tell your dentist you are receiving this medicine. What side effects may I notice from  receiving this medicine? Side effects that you should report to your doctor or health care professional as soon as possible:  allergic reactions like skin rash, itching or hives, swelling of the face, lips, or tongue  breathing problems  cough  low blood counts - this medicine may decrease the number of white blood cells, red blood cells, and platelets. You may be at increased risk for infections and bleeding  nausea, vomiting  pain, redness, or irritation at site where injected  pain, tingling, numbness in the hands or feet  signs and symptoms of bleeding such as bloody or black, tarry stools; red or dark brown urine; spitting up blood or brown material that looks like coffee grounds; red spots on the skin; unusual bruising or bleeding from the eyes, gums, or nose  signs and symptoms of a dangerous change in heartbeat or heart rhythm like chest pain; dizziness; fast, irregular heartbeat; palpitations; feeling faint or lightheaded; falls  signs and symptoms of infection like fever; chills; cough; sore throat; pain or trouble passing urine  signs and symptoms of liver injury like dark yellow or brown urine; general ill feeling or flu-like symptoms; light-colored stools;  loss of appetite; nausea; right upper belly pain; unusually weak or tired; yellowing of the eyes or skin  signs and symptoms of low red blood cells or anemia such as unusually weak or tired; feeling faint or lightheaded; falls  signs and symptoms of muscle injury like dark urine; trouble passing urine or change in the amount of urine; unusually weak or tired; muscle pain; back pain Side effects that usually do not require medical attention (report to your doctor or health care professional if they continue or are bothersome):  changes in taste  diarrhea  gas  hair loss  loss of appetite  mouth sores This list may not describe all possible side effects. Call your doctor for medical advice about side effects. You may report side effects to FDA at 1-800-FDA-1088. Where should I keep my medicine? This drug is given in a hospital or clinic and will not be stored at home. NOTE: This sheet is a summary. It may not cover all possible information. If you have questions about this medicine, talk to your doctor, pharmacist, or health care provider.  2021 [employer] Standard (2018-09-12 12:20:35) Palonosetron Injection What is this medicine? PALONOSETRON (pal oh NOE se tron) is used to prevent nausea and vomiting caused by chemotherapy. It also helps prevent delayed nausea and vomiting that may occur a few days after your treatment. This medicine may be used for other purposes; ask your health care provider or pharmacist if you have questions. COMMON BRAND NAME(S): Aloxi What should I tell my health care provider before I take this medicine? They need to know if you have any of these conditions:  an unusual or allergic reaction to palonosetron, dolasetron, granisetron, ondansetron, other medicines, foods, dyes, or preservatives  pregnant or trying to get pregnant  breast-feeding How should I use this medicine? This medicine is for infusion into a vein. It is given by a health care  professional in a hospital or clinic setting. Talk to your pediatrician regarding the use of this medicine in children. While this drug may be prescribed for children as young as 1 month for selected conditions, precautions do apply. Overdosage: If you think you have taken too much of this medicine contact a poison control center or emergency room at once. NOTE: This medicine is only for you. Do not share this medicine  with others. What if I miss a dose? This does not apply. What may interact with this medicine?  certain medicines for depression, anxiety, or psychotic disturbances  fentanyl  linezolid  MAOIs like Carbex, Eldepryl, Marplan, Nardil, and Parnate  methylene blue (injected into a vein)  tramadol This list may not describe all possible interactions. Give your health care provider a list of all the medicines, herbs, non-prescription drugs, or dietary supplements you use. Also tell them if you smoke, drink alcohol, or use illegal drugs. Some items may interact with your medicine. What should I watch for while using this medicine? Your condition will be monitored carefully while you are receiving this medicine. What side effects may I notice from receiving this medicine? Side effects that you should report to your doctor or health care professional as soon as possible:  allergic reactions like skin rash, itching or hives, swelling of the face, lips, or tongue  breathing problems  confusion  dizziness  fast, irregular heartbeat  fever and chills  loss of balance or coordination  seizures  sweating  swelling of the hands and feet  tremors  unusually weak or tired Side effects that usually do not require medical attention (report to your doctor or health care professional if they continue or are bothersome):  constipation or diarrhea  headache This list may not describe all possible side effects. Call your doctor for medical advice about side effects. You may  report side effects to FDA at 1-800-FDA-1088. Where should I keep my medicine? This drug is given in a hospital or clinic and will not be stored at home. NOTE: This sheet is a summary. It may not cover all possible information. If you have questions about this medicine, talk to your doctor, pharmacist, or health care provider.  2021 [employer]/Gold Standard (2013-03-01 10:38:36) Dexamethasone injection What is this medicine? DEXAMETHASONE (dex a METH a sone) is a corticosteroid. It is used to treat inflammation of the skin, joints, lungs, and other organs. Common conditions treated include asthma, allergies, and arthritis. It is also used for other conditions, like blood disorders and diseases of the adrenal glands. This medicine may be used for other purposes; ask your health care provider or pharmacist if you have questions. COMMON BRAND NAME(S): Decadron, DoubleDex, ReadySharp Dexamethasone, Simplist Dexamethasone, Solurex What should I tell my health care provider before I take this medicine? They need to know if you have any of these conditions:  Cushing's syndrome  diabetes  glaucoma  heart disease  high blood pressure  infection like herpes, measles, tuberculosis, or chickenpox  kidney disease  liver disease  mental illness  myasthenia gravis  osteoporosis  previous heart attack  seizures  stomach or intestine problems  thyroid disease  an unusual or allergic reaction to dexamethasone, corticosteroids, other medicines, lactose, foods, dyes, or preservatives  pregnant or trying to get pregnant  breast-feeding How should I use this medicine? This medicine is for injection into a muscle, joint, lesion, soft tissue, or vein. It is given by a health care professional in a hospital or clinic setting. Talk to your pediatrician regarding the use of this medicine in children. Special care may be needed. Overdosage: If you think you have taken too much of this medicine  contact a poison control center or emergency room at once. NOTE: This medicine is only for you. Do not share this medicine with others. What if I miss a dose? This may not apply. If you are having a series of injections over  a prolonged period, try not to miss an appointment. Call your doctor or health care professional to reschedule if you are unable to keep an appointment. What may interact with this medicine? Do not take this medicine with any of the following medications:  live virus vaccines This medicine may also interact with the following medications:  aminoglutethimide  amphotericin B  aspirin and aspirin-like medicines  certain antibiotics like erythromycin, clarithromycin, and troleandomycin  certain antivirals for HIV or hepatitis  certain medicines for seizures like carbamazepine, phenobarbital, phenytoin  certain medicines to treat myasthenia gravis  cholestyramine  cyclosporine  digoxin  diuretics  ephedrine  female hormones, like estrogen or progestins and birth control pills  insulin or other medicines for diabetes  isoniazid  ketoconazole  medicines that relax muscles for surgery  mifepristone  NSAIDs, medicines for pain and inflammation, like ibuprofen or naproxen  rifampin  skin tests for allergies  thalidomide  vaccines  warfarin This list may not describe all possible interactions. Give your health care provider a list of all the medicines, herbs, non-prescription drugs, or dietary supplements you use. Also tell them if you smoke, drink alcohol, or use illegal drugs. Some items may interact with your medicine. What should I watch for while using this medicine? Visit your health care professional for regular checks on your progress. Tell your health care professional if your symptoms do not start to get better or if they get worse. Your condition will be monitored carefully while you are receiving this medicine. Wear a medical ID  bracelet or chain. Carry a card that describes your disease and details of your medicine and dosage times. This medicine may increase your risk of getting an infection. Call your health care professional for advice if you get a fever, chills, or sore throat, or other symptoms of a cold or flu. Do not treat yourself. Try to avoid being around people who are sick. Call your health care professional if you are around anyone with measles, chickenpox, or if you develop sores or blisters that do not heal properly. If you are going to need surgery or other procedures, tell your doctor or health care professional that you have taken this medicine within the last 12 months. Ask your doctor or health care professional about your diet. You may need to lower the amount of salt you eat. This medicine may increase blood sugar. Ask your healthcare provider if changes in diet or medicines are needed if you have diabetes. What side effects may I notice from receiving this medicine? Side effects that you should report to your doctor or health care professional as soon as possible:  allergic reactions like skin rash, itching or hives, swelling of the face, lips, or tongue  bloody or black, tarry stools  changes in emotions or moods  changes in vision  confusion, excitement, restlessness  depressed mood  eye pain  hallucinations  muscle weakness  severe or sudden stomach or belly pain  signs and symptoms of high blood sugar such as being more thirsty or hungry or having to urinate more than normal. You may also feel very tired or have blurry vision.  signs and symptoms of infection like fever; chills; cough; sore throat; pain or trouble passing urine  swelling of ankles, feet  unusual bruising or bleeding  wounds that do not heal Side effects that usually do not require medical attention (report to your doctor or health care professional if they continue or are bothersome):  increased  appetite  increased growth of face or body hair  headache  nausea, vomiting  pain, redness, or irritation at site where injected  skin problems, acne, thin and shiny skin  trouble sleeping  weight gain This list may not describe all possible side effects. Call your doctor for medical advice about side effects. You may report side effects to FDA at 1-800-FDA-1088. Where should I keep my medicine? This medicine is given in a hospital or clinic and will not be stored at home. NOTE: This sheet is a summary. It may not cover all possible information. If you have questions about this medicine, talk to your doctor, pharmacist, or health care provider.  2021 Elsevier/Gold Standard (2018-11-06 13:51:58)

## 2020-09-23 ENCOUNTER — Telehealth: Payer: Self-pay | Admitting: *Deleted

## 2020-09-23 ENCOUNTER — Encounter: Payer: Self-pay | Admitting: Oncology

## 2020-09-23 NOTE — Telephone Encounter (Signed)
Patient called reporting that she has a sinus infection and is running a temp of 99.3. She currently has a chemotherapy pump on. She is asking if we are to treat her or if she needs to call her PCP and if there are any medications she should not take for this. Please advise

## 2020-09-23 NOTE — Telephone Encounter (Signed)
Phoned pt. Agreed to treat symptomatically for now. Reports no fever. Last temp was 98.9. Informed her that if fever increases to over 100 or she is not feeling any better, we could see her tomorrow in [facility] when she is here for pump d/c. Pt in agreement.

## 2020-09-24 ENCOUNTER — Inpatient Hospital Stay: Payer: BC Managed Care – PPO

## 2020-09-24 ENCOUNTER — Telehealth: Payer: Self-pay | Admitting: Licensed Clinical Social Worker

## 2020-09-24 DIAGNOSIS — C189 Malignant neoplasm of colon, unspecified: Secondary | ICD-10-CM

## 2020-09-24 DIAGNOSIS — C187 Malignant neoplasm of sigmoid colon: Secondary | ICD-10-CM | POA: Diagnosis not present

## 2020-09-24 MED ORDER — SODIUM CHLORIDE 0.9% FLUSH
10.0000 mL | INTRAVENOUS | Status: DC | PRN
  Administered 2020-09-24: 10 mL
  Filled 2020-09-24: qty 10

## 2020-09-24 MED ORDER — HEPARIN SOD (PORK) LOCK FLUSH 100 UNIT/ML IV SOLN
500.0000 [IU] | Freq: Once | INTRAVENOUS | Status: AC | PRN
  Administered 2020-09-24: 500 [IU]
  Filled 2020-09-24: qty 5

## 2020-09-30 ENCOUNTER — Encounter: Payer: Self-pay | Admitting: Licensed Clinical Social Worker

## 2020-09-30 ENCOUNTER — Encounter: Payer: Self-pay | Admitting: Oncology

## 2020-09-30 ENCOUNTER — Ambulatory Visit: Payer: Self-pay | Admitting: Licensed Clinical Social Worker

## 2020-09-30 DIAGNOSIS — Z8042 Family history of malignant neoplasm of prostate: Secondary | ICD-10-CM

## 2020-09-30 DIAGNOSIS — Z1379 Encounter for other screening for genetic and chromosomal anomalies: Secondary | ICD-10-CM | POA: Insufficient documentation

## 2020-09-30 DIAGNOSIS — Z806 Family history of leukemia: Secondary | ICD-10-CM

## 2020-09-30 DIAGNOSIS — C189 Malignant neoplasm of colon, unspecified: Secondary | ICD-10-CM

## 2020-09-30 DIAGNOSIS — Z8051 Family history of malignant neoplasm of kidney: Secondary | ICD-10-CM

## 2020-09-30 NOTE — Telephone Encounter (Signed)
Revealed negative genetic testing.  We discussed that we do not know why she has cancer or why there is cancer in the family. It could be due to a different gene that we are not testing, or something our current technology cannot pick up.  It will be important for her to keep in contact with genetics to learn if additional testing may be needed in the future.  

## 2020-09-30 NOTE — Progress Notes (Signed)
HPI:  Sherri Howell was previously seen in the [place] clinic due to a personal and family history of cancer and concerns regarding a hereditary predisposition to cancer. Please refer to our prior cancer genetics clinic note for more information regarding our discussion, assessment and recommendations, at the time. Sherri Howell recent genetic test results were disclosed to her, as were recommendations warranted by these results. These results and recommendations are discussed in more detail below.  CANCER HISTORY:  Oncology History  Colon cancer ([place])  08/15/2020 Initial Diagnosis   Colon cancer ([place])   08/15/2020 Cancer Staging   Staging form: Colon and Rectum, AJCC 8th Edition - Clinical stage from 08/15/2020: Stage IVA (cTX, cNX, pM1a) - Signed by Lloyd Huger, MD on 08/15/2020   09/08/2020 -  Chemotherapy    Patient is on Treatment Plan: COLORECTAL FOLFOX Q14D X 6 MONTHS       Genetic Testing   Negative genetic testing. No pathogenic variants identified on the [facility] CancerNext-Expanded+RNA panel. The report date is 09/24/2020.  The CancerNext-Expanded + RNAinsight gene panel offered by [employer] and includes sequencing and rearrangement analysis for the following 77 genes: IP, ALK, APC*, ATM*, AXIN2, BAP1, BARD1, BLM, BMPR1A, BRCA1*, BRCA2*, BRIP1*, CDC73, CDH1*,CDK4, CDKN1B, CDKN2A, CHEK2*, CTNNA1, DICER1, FANCC, FH, FLCN, GALNT12, KIF1B, LZTR1, MAX, MEN1, MET, MLH1*, MSH2*, MSH3, MSH6*, MUTYH*, NBN, NF1*, NF2, NTHL1, PALB2*, PHOX2B, PMS2*, POT1, PRKAR1A, PTCH1, PTEN*, RAD51C*, RAD51D*,RB1, RECQL, RET, SDHA, SDHAF2, SDHB, SDHC, SDHD, SMAD4, SMARCA4, SMARCB1, SMARCE1, STK11, SUFU, TMEM127, TP53*,TSC1, TSC2, VHL and XRCC2 (sequencing and deletion/duplication); EGFR, EGLN1, HOXB13, KIT, MITF, PDGFRA, POLD1 and POLE (sequencing only); EPCAM and GREM1 (deletion/duplication only).     FAMILY HISTORY:  We obtained a detailed, 4-generation family history.  Significant diagnoses  are listed below: Family History  Problem Relation Age of Onset  . Breast cancer Cousin        pat cousin  . Prostate cancer Mother 24  . Kidney cancer Maternal Uncle 52  . Leukemia Maternal Grandfather 63    Sherri Howell has 2 living sons, Corene Cornea, 57, and Quillian Quince, [place]. Her third son Orene Desanctis passed shortly after birth due to an aorta condition. Sherri Howell had 2 brothers and 1 sister, no cancers.  Sherri Howell mother is living at 49, patient has 2 maternal uncles. One had kidney cancer at 56. No cousins with cancer. Maternal grandfather had leukemia at 39 and died at 7. Grandmother passed at 71.  Sherri Howell father died at 69, he had history of prostate cancer at 59. Paternal grandmother passed at 3, grandfather passed at 59.  Sherri Howell is unaware of previous family history of genetic testing for hereditary cancer risks. Patient's maternal ancestors are of [place] descent, and paternal ancestors are of [place] descent. There is no reported Ashkenazi Jewish ancestry. There is no known consanguinity.    GENETIC TEST RESULTS: Genetic testing reported out on 09/24/2020 through the [facility] CancerNext-Expanded+RNA cancer panel found no pathogenic mutations.   The CancerNext-Expanded + RNAinsight gene panel offered by [employer] and includes sequencing and rearrangement analysis for the following 77 genes: IP, ALK, APC*, ATM*, AXIN2, BAP1, BARD1, BLM, BMPR1A, BRCA1*, BRCA2*, BRIP1*, CDC73, CDH1*,CDK4, CDKN1B, CDKN2A, CHEK2*, CTNNA1, DICER1, FANCC, FH, FLCN, GALNT12, KIF1B, LZTR1, MAX, MEN1, MET, MLH1*, MSH2*, MSH3, MSH6*, MUTYH*, NBN, NF1*, NF2, NTHL1, PALB2*, PHOX2B, PMS2*, POT1, PRKAR1A, PTCH1, PTEN*, RAD51C*, RAD51D*,RB1, RECQL, RET, SDHA, SDHAF2, SDHB, SDHC, SDHD, SMAD4, SMARCA4, SMARCB1, SMARCE1, STK11, SUFU, TMEM127, TP53*,TSC1, TSC2, VHL and XRCC2 (sequencing and deletion/duplication);  EGFR, EGLN1, HOXB13, KIT, MITF, PDGFRA, POLD1 and POLE (sequencing only); EPCAM and GREM1 (deletion/duplication  only).   The test report has been scanned into EPIC and is located under the Molecular Pathology section of the Results Review tab.  A portion of the result report is included below for reference.     We discussed with Sherri Howell that because current genetic testing is not perfect, it is possible there may be a gene mutation in one of these genes that current testing cannot detect, but that chance is small.  We also discussed, that there could be another gene that has not yet been discovered, or that we have not yet tested, that is responsible for the cancer diagnoses in the family. It is also possible there is a hereditary cause for the cancer in the family that Sherri Howell did not inherit and therefore was not identified in her testing.  Therefore, it is important to remain in touch with cancer genetics in the future so that we can continue to offer Sherri Howell the most up to date genetic testing.   ADDITIONAL GENETIC TESTING: We discussed with Sherri Howell that her genetic testing was fairly extensive.  If there are genes identified to increase cancer risk that can be analyzed in the future, we would be happy to discuss and coordinate this testing at that time.    CANCER SCREENING RECOMMENDATIONS: Sherri Howell test result is considered negative (normal).  This means that we have not identified a hereditary cause for her  personal and family history of cancer at this time. Most cancers happen by chance and this negative test suggests that her cancer may fall into this category.    While reassuring, this does not definitively rule out a hereditary predisposition to cancer. It is still possible that there could be genetic mutations that are undetectable by current technology. There could be genetic mutations in genes that have not been tested or identified to increase cancer risk.  Therefore, it is recommended she continue to follow the cancer management and screening guidelines provided by her oncology and  primary healthcare provider.   An individual's cancer risk and medical management are not determined by genetic test results alone. Overall cancer risk assessment incorporates additional factors, including personal medical history, family history, and any available genetic information that may result in a personalized plan for cancer prevention and surveillance.  RECOMMENDATIONS FOR FAMILY MEMBERS:  Relatives in this family might be at some increased risk of developing cancer, over the general population risk, simply due to the family history of cancer.  We recommended female relatives in this family have a yearly mammogram beginning at age 57, or 63 years younger than the earliest onset of cancer, an annual clinical breast exam, and perform monthly breast self-exams. Female relatives in this family should also have a gynecological exam as recommended by their primary provider.  All family members should be referred for colonoscopy starting at age 55.   FOLLOW-UP: Lastly, we discussed with Sherri Howell that cancer genetics is a rapidly advancing field and it is possible that new genetic tests will be appropriate for her and/or her family members in the future. We encouraged her to remain in contact with cancer genetics on an annual basis so we can update her personal and family histories and let her know of advances in cancer genetics that may benefit this family.   Our contact number was provided. Sherri Howell questions were answered to her satisfaction, and she knows she  is welcome to call us at anytime with additional questions or concerns.   Sherri Rogue, MS, [facility] Genetic Counselor [email or web address] Phone: [phone]

## 2020-10-01 NOTE — Progress Notes (Signed)
Sherri Howell  Telephone:([phone]) [phone] Fax:(336) [phone]  ID: Sherri Howell OB: 1970/05/17  MR#: [id number]  HWE#:[id number]  Patient Care Team: Kirk Ruths, MD as PCP - General (Internal Medicine)    CHIEF COMPLAINT: Colon cancer metastasized to liver.  INTERVAL HISTORY: Patient returns to clinic today for further evaluation and consideration of cycle 3 of FOLFOX.  She continues to have increased weakness and fatigue 3 to 4 days after her treatment, but this subsequently resolves and she feels back to her baseline.  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 She has no neurologic complaints. She denies any recent fevers or illnesses.  She has a fair appetite and denies weight loss.  She has no chest pain, shortness of breath, cough, or hemoptysis.  She denies any nausea, vomiting, constipation, or diarrhea.  She has no further melena or hematochezia.  She has no urinary complaints.  Patient offers no further specific complaints today.  REVIEW OF SYSTEMS:   Review of Systems  Constitutional: Negative.  Negative for fever, malaise/fatigue and weight loss.  Respiratory: Negative.  Negative for cough, hemoptysis and shortness of breath.   Cardiovascular: Negative.  Negative for chest pain and leg swelling.  Gastrointestinal: Negative.  Negative for abdominal pain, blood in stool and melena.  Genitourinary: Negative.  Negative for dysuria.  Musculoskeletal: Negative.  Negative for back pain.  Skin: Negative.  Negative for rash.  Neurological: Negative.  Negative for dizziness, seizures, weakness and headaches.  Psychiatric/Behavioral: Negative.  The patient is not nervous/anxious.     As per HPI. Otherwise, a complete review of systems is negative.  PAST MEDICAL HISTORY: Past Medical History:  Diagnosis Date  . Colon cancer ([place]) 08/15/2020  . Family history of kidney cancer   . Family history of leukemia   . Family  history of prostate cancer     PAST SURGICAL HISTORY: No past surgical history on file.  FAMILY HISTORY: Family History  Problem Relation Age of Onset  . Breast cancer Cousin        pat cousin  . Prostate cancer Mother 69  . Kidney cancer Maternal Uncle 52  . Leukemia Maternal Grandfather 3    ADVANCED DIRECTIVES (Y/N):  N  HEALTH MAINTENANCE: Social History   Tobacco Use  . Smoking status: Never Smoker  . Smokeless tobacco: Never Used  Substance Use Topics  . Alcohol use: Never  . Drug use: Never     Colonoscopy:  PAP:  Bone density:  Lipid panel:  No Known Allergies  Current Outpatient Medications  Medication Sig Dispense Refill  . cetirizine (ZYRTEC) 10 MG tablet Take 1 tablet by mouth daily.    . cholecalciferol (VITAMIN D3) 25 MCG (1000 UNIT) tablet Take 1,000 Units by mouth daily.    Marland Kitchen docusate sodium (COLACE) 100 MG capsule Take 100 mg by mouth 2 (two) times daily.    Marland Kitchen lidocaine-prilocaine (EMLA) cream Apply 1 application topically as needed. 30 g 3  . Multiple Vitamin (MULTIVITAMIN) tablet Take 1 tablet by mouth daily.    . Omega-3 Fatty Acids (FISH OIL) 1000 MG CAPS Take 1 capsule by mouth daily.    . ondansetron (ZOFRAN) 8 MG tablet Take 1 tablet (8 mg total) by mouth 2 (two) times daily as needed for refractory nausea / vomiting. 60 tablet 1  . polyethylene glycol (MIRALAX / GLYCOLAX) 17 g packet Take 17 g by mouth daily.    . prochlorperazine (COMPAZINE) 10 MG  tablet Take 1 tablet (10 mg total) by mouth every 6 (six) hours as needed (Nausea or vomiting). 60 tablet 1   No current facility-administered medications for this visit.   Facility-Administered Medications Ordered in Other Visits  Medication Dose Route Frequency Provider Last Rate Last Admin  . sodium chloride flush (NS) 0.9 % injection 10 mL  10 mL Intravenous Once Lloyd Huger, MD        OBJECTIVE: Vitals:   10/06/20 0854  BP: (!) 158/76  Pulse: 67  Resp: 17  Temp: (!) 97.5 F  (36.4 C)  SpO2: 100%     Body mass index is 39.74 kg/m.    ECOG FS:0 - Asymptomatic  General: Well-developed, well-nourished, no acute distress. Eyes: Pink conjunctiva, anicteric sclera. HEENT: Normocephalic, moist mucous membranes. Lungs: No audible wheezing or coughing. Heart: Regular rate and rhythm. Abdomen: Soft, nontender, no obvious distention. Musculoskeletal: No edema, cyanosis, or clubbing. Neuro: Alert, answering all questions appropriately. Cranial nerves grossly intact. Skin: No rashes or petechiae noted. Psych: Normal affect.  LAB RESULTS:  Lab Results  Component Value Date   NA 136 10/06/2020   K 3.8 10/06/2020   CL 104 10/06/2020   CO2 25 10/06/2020   GLUCOSE 114 (H) 10/06/2020   BUN 15 10/06/2020   CREATININE 0.82 10/06/2020   CALCIUM 10.8 (H) 10/06/2020   PROT 7.0 10/06/2020   ALBUMIN 3.7 10/06/2020   AST 27 10/06/2020   ALT 20 10/06/2020   ALKPHOS 68 10/06/2020   BILITOT 0.6 10/06/2020   GFRNONAA >60 10/06/2020    Lab Results  Component Value Date   WBC 5.0 10/06/2020   NEUTROABS 2.5 10/06/2020   HGB 13.7 10/06/2020   HCT 40.8 10/06/2020   MCV 93.6 10/06/2020   PLT 205 10/06/2020     STUDIES: No results found.  ASSESSMENT: Colon cancer metastasized to liver.  PLAN:    1. Colon cancer metastasized to liver: Patient initially presented to [facility] on on July 28, 2020 and underwent surgical intervention for metastatic colon cancer to the liver.  She was taken to the operating room by Cameron Sprang MD and Binnie Rail MD who performed a robot assisted laparoscopy, robot-assisted partial hepatectomy (Segment 4A/8), microwave ablation (x2, segments 7, 7/8), intraoperative ultrasound and robotic low anterior resection. Port has been placed.  Proceed with cycle 3 of adjuvant FOLFOX today.  Return to clinic in 2 days for pump removal and then in 2 weeks for further evaluation and consideration of cycle 4. 2.  Genetics  referral: Negative. 3.  Polycythemia: Resolved. 4.  Hypercalcemia: Improving, monitor.  I spent a total of 30 minutes reviewing chart data, face-to-face evaluation with the patient, counseling and coordination of care as detailed above.    Patient expressed understanding and was in agreement with this plan. She also understands that She can call clinic at any time with any questions, concerns, or complaints.   Cancer Staging Colon cancer [facility]) Staging form: Colon and Rectum, AJCC 8th Edition - Clinical stage from 08/15/2020: Stage IVA (cTX, cNX, pM1a) - Signed by Lloyd Huger, MD on 08/15/2020   Lloyd Huger, MD   10/06/2020 9:25 AM

## 2020-10-06 ENCOUNTER — Encounter: Payer: Self-pay | Admitting: Oncology

## 2020-10-06 ENCOUNTER — Other Ambulatory Visit: Payer: Self-pay

## 2020-10-06 ENCOUNTER — Inpatient Hospital Stay: Payer: BC Managed Care – PPO

## 2020-10-06 ENCOUNTER — Inpatient Hospital Stay (HOSPITAL_BASED_OUTPATIENT_CLINIC_OR_DEPARTMENT_OTHER): Payer: BC Managed Care – PPO | Admitting: Oncology

## 2020-10-06 VITALS — BP 158/76 | HR 67 | Temp 97.5°F | Resp 17 | Ht 69.0 in | Wt 269.1 lb

## 2020-10-06 DIAGNOSIS — C189 Malignant neoplasm of colon, unspecified: Secondary | ICD-10-CM | POA: Diagnosis not present

## 2020-10-06 DIAGNOSIS — C187 Malignant neoplasm of sigmoid colon: Secondary | ICD-10-CM | POA: Diagnosis not present

## 2020-10-06 LAB — CBC WITH DIFFERENTIAL/PLATELET
Abs Immature Granulocytes: 0.01 10*3/uL (ref 0.00–0.07)
Basophils Absolute: 0 10*3/uL (ref 0.0–0.1)
Basophils Relative: 0 %
Eosinophils Absolute: 0.1 10*3/uL (ref 0.0–0.5)
Eosinophils Relative: 3 %
HCT: 40.8 % (ref 36.0–46.0)
Hemoglobin: 13.7 g/dL (ref 12.0–15.0)
Immature Granulocytes: 0 %
Lymphocytes Relative: 29 %
Lymphs Abs: 1.5 10*3/uL (ref 0.7–4.0)
MCH: 31.4 pg (ref 26.0–34.0)
MCHC: 33.6 g/dL (ref 30.0–36.0)
MCV: 93.6 fL (ref 80.0–100.0)
Monocytes Absolute: 0.9 10*3/uL (ref 0.1–1.0)
Monocytes Relative: 17 %
Neutro Abs: 2.5 10*3/uL (ref 1.7–7.7)
Neutrophils Relative %: 51 %
Platelets: 205 10*3/uL (ref 150–400)
RBC: 4.36 MIL/uL (ref 3.87–5.11)
RDW: 13.7 % (ref 11.5–15.5)
WBC: 5 10*3/uL (ref 4.0–10.5)
nRBC: 0 % (ref 0.0–0.2)

## 2020-10-06 LAB — COMPREHENSIVE METABOLIC PANEL
ALT: 20 U/L (ref 0–44)
AST: 27 U/L (ref 15–41)
Albumin: 3.7 g/dL (ref 3.5–5.0)
Alkaline Phosphatase: 68 U/L (ref 38–126)
Anion gap: 7 (ref 5–15)
BUN: 15 mg/dL (ref 6–20)
CO2: 25 mmol/L (ref 22–32)
Calcium: 10.8 mg/dL — ABNORMAL HIGH (ref 8.9–10.3)
Chloride: 104 mmol/L (ref 98–111)
Creatinine, Ser: 0.82 mg/dL (ref 0.44–1.00)
GFR, Estimated: >60 mL/min (ref >60)
Glucose, Bld: 114 mg/dL — ABNORMAL HIGH (ref 70–99)
Potassium: 3.8 mmol/L (ref 3.5–5.1)
Sodium: 136 mmol/L (ref 135–145)
Total Bilirubin: 0.6 mg/dL (ref 0.3–1.2)
Total Protein: 7 g/dL (ref 6.5–8.1)

## 2020-10-06 MED ORDER — SODIUM CHLORIDE 0.9 % IV SOLN
10.0000 mg | Freq: Once | INTRAVENOUS | Status: AC
  Administered 2020-10-06: 10 mg via INTRAVENOUS
  Filled 2020-10-06: qty 10

## 2020-10-06 MED ORDER — HEPARIN SOD (PORK) LOCK FLUSH 100 UNIT/ML IV SOLN
INTRAVENOUS | Status: AC
  Filled 2020-10-06: qty 5

## 2020-10-06 MED ORDER — OXALIPLATIN CHEMO INJECTION 100 MG/20ML
200.0000 mg | Freq: Once | INTRAVENOUS | Status: AC
  Administered 2020-10-06: 200 mg via INTRAVENOUS
  Filled 2020-10-06: qty 40

## 2020-10-06 MED ORDER — PALONOSETRON HCL INJECTION 0.25 MG/5ML
0.2500 mg | Freq: Once | INTRAVENOUS | Status: AC
  Administered 2020-10-06: 0.25 mg via INTRAVENOUS
  Filled 2020-10-06: qty 5

## 2020-10-06 MED ORDER — FLUOROURACIL CHEMO INJECTION 2.5 GM/50ML
400.0000 mg/m2 | Freq: Once | INTRAVENOUS | Status: AC
  Administered 2020-10-06: 950 mg via INTRAVENOUS
  Filled 2020-10-06: qty 19

## 2020-10-06 MED ORDER — SODIUM CHLORIDE 0.9 % IV SOLN
2400.0000 mg/m2 | INTRAVENOUS | Status: DC
  Administered 2020-10-06: 5800 mg via INTRAVENOUS
  Filled 2020-10-06: qty 116

## 2020-10-06 MED ORDER — SODIUM CHLORIDE 0.9% FLUSH
10.0000 mL | Freq: Once | INTRAVENOUS | Status: DC
  Filled 2020-10-06: qty 10

## 2020-10-06 MED ORDER — LEUCOVORIN CALCIUM INJECTION 350 MG
950.0000 mg | Freq: Once | INTRAVENOUS | Status: AC
  Administered 2020-10-06: 950 mg via INTRAVENOUS
  Filled 2020-10-06: qty 47.5

## 2020-10-06 MED ORDER — DEXTROSE 5 % IV SOLN
Freq: Once | INTRAVENOUS | Status: AC
Start: 2020-10-06 — End: 2020-10-06
  Filled 2020-10-06: qty 250

## 2020-10-06 NOTE — Progress Notes (Signed)
Patient tolerated FOLFOX infusion well today, no concerns voiced. Patient discharged with chemo pump. No questions at this time. Stable.

## 2020-10-06 NOTE — Patient Instructions (Addendum)
[facility]  Discharge Instructions: Thank you for choosing [facility] to provide your oncology and hematology care.  If you have a lab appointment with the [facility], please go directly to the [facility] and check in at the registration area.  Wear comfortable clothing and clothing appropriate for easy access to any Portacath or PICC line.   We strive to give you quality time with your provider. You may need to reschedule your appointment if you arrive late (15 or more minutes).  Arriving late affects you and other patients whose appointments are after yours.  Also, if you miss three or more appointments without notifying the office, you may be dismissed from the clinic at the provider's discretion.      For prescription refill requests, have your pharmacy contact our office and allow 72 hours for refills to be completed.    Today you received the following chemotherapy and/or immunotherapy agents: FOLFOX   To help prevent nausea and vomiting after your treatment, we encourage you to take your nausea medication as directed.  BELOW ARE SYMPTOMS THAT SHOULD BE REPORTED IMMEDIATELY: . *FEVER GREATER THAN 100.4 F (38 C) OR HIGHER . *CHILLS OR SWEATING . *NAUSEA AND VOMITING THAT IS NOT CONTROLLED WITH YOUR NAUSEA MEDICATION . *UNUSUAL SHORTNESS OF BREATH . *UNUSUAL BRUISING OR BLEEDING . *URINARY PROBLEMS (pain or burning when urinating, or frequent urination) . *BOWEL PROBLEMS (unusual diarrhea, constipation, pain near the anus) . TENDERNESS IN MOUTH AND THROAT WITH OR WITHOUT PRESENCE OF ULCERS (sore throat, sores in mouth, or a toothache) . UNUSUAL RASH, SWELLING OR PAIN  . UNUSUAL VAGINAL DISCHARGE OR ITCHING   Items with * indicate a potential emergency and should be followed up as soon as possible or go to the Emergency Department if any problems should occur.  Please show the CHEMOTHERAPY ALERT CARD or IMMUNOTHERAPY ALERT  CARD at check-in to the Emergency Department and triage nurse.  Should you have questions after your visit or need to cancel or reschedule your appointment, please contact [facility]  [phone] and follow the prompts.  Office hours are 8:00 a.m. to 4:30 p.m. Monday - Friday. Please note that voicemails left after 4:00 p.m. may not be returned until the following business day.  We are closed weekends and major holidays. You have access to a nurse at all times for urgent questions. Please call the main number to the clinic [phone] and follow the prompts.  For any non-urgent questions, you may also contact your provider using MyChart. We now offer e-Visits for anyone 57 and older to request care online for non-urgent symptoms. For details visit [email or web address].   Also download the MyChart app! Go to the app store, search "MyChart", open the app, select [facility], and log in with your MyChart username and password.  Due to Covid, a mask is required upon entering the hospital/clinic. If you do not have a mask, one will be given to you upon arrival. For doctor visits, patients may have 1 support person aged 67 or older with them. For treatment visits, patients cannot have anyone with them due to current Covid guidelines and our immunocompromised population.

## 2020-10-08 ENCOUNTER — Inpatient Hospital Stay: Payer: BC Managed Care – PPO | Attending: Oncology

## 2020-10-08 DIAGNOSIS — Z803 Family history of malignant neoplasm of breast: Secondary | ICD-10-CM | POA: Insufficient documentation

## 2020-10-08 DIAGNOSIS — Z79899 Other long term (current) drug therapy: Secondary | ICD-10-CM | POA: Insufficient documentation

## 2020-10-08 DIAGNOSIS — Z806 Family history of leukemia: Secondary | ICD-10-CM | POA: Insufficient documentation

## 2020-10-08 DIAGNOSIS — D72819 Decreased white blood cell count, unspecified: Secondary | ICD-10-CM | POA: Insufficient documentation

## 2020-10-08 DIAGNOSIS — C189 Malignant neoplasm of colon, unspecified: Secondary | ICD-10-CM | POA: Diagnosis present

## 2020-10-08 DIAGNOSIS — C787 Secondary malignant neoplasm of liver and intrahepatic bile duct: Secondary | ICD-10-CM | POA: Diagnosis present

## 2020-10-08 DIAGNOSIS — Z8051 Family history of malignant neoplasm of kidney: Secondary | ICD-10-CM | POA: Diagnosis not present

## 2020-10-08 DIAGNOSIS — D696 Thrombocytopenia, unspecified: Secondary | ICD-10-CM | POA: Insufficient documentation

## 2020-10-08 DIAGNOSIS — Z8042 Family history of malignant neoplasm of prostate: Secondary | ICD-10-CM | POA: Diagnosis not present

## 2020-10-08 DIAGNOSIS — Z5111 Encounter for antineoplastic chemotherapy: Secondary | ICD-10-CM | POA: Insufficient documentation

## 2020-10-08 DIAGNOSIS — F419 Anxiety disorder, unspecified: Secondary | ICD-10-CM | POA: Insufficient documentation

## 2020-10-08 MED ORDER — HEPARIN SOD (PORK) LOCK FLUSH 100 UNIT/ML IV SOLN
500.0000 [IU] | Freq: Once | INTRAVENOUS | Status: AC | PRN
  Administered 2020-10-08: 500 [IU]
  Filled 2020-10-08: qty 5

## 2020-10-08 MED ORDER — HEPARIN SOD (PORK) LOCK FLUSH 100 UNIT/ML IV SOLN
INTRAVENOUS | Status: AC
  Filled 2020-10-08: qty 5

## 2020-10-08 MED ORDER — SODIUM CHLORIDE 0.9% FLUSH
10.0000 mL | INTRAVENOUS | Status: DC | PRN
  Administered 2020-10-08: 10 mL
  Filled 2020-10-08: qty 10

## 2020-10-09 ENCOUNTER — Encounter: Payer: Self-pay | Admitting: Oncology

## 2020-10-09 NOTE — Telephone Encounter (Signed)
[facility] forms filled out for [employer] and faxed over to [phone]

## 2020-10-16 NOTE — Progress Notes (Signed)
[facility]  [phone] [phone]  ID: Sherri Howell OB: Aug 01, 1970  MR#: [id number]  NLG#:[id number]  Patient Care Team: Kirk Ruths, MD as PCP - General (Internal Medicine)    CHIEF COMPLAINT: Colon cancer metastasized to liver.  INTERVAL HISTORY: Patient returns to clinic today for further evaluation and consideration of cycle 4 of FOLFOX.  She has noticed increased anxiety, but otherwise feels well.  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 She has no other neurologic complaints. She denies any recent fevers or illnesses.  She has a fair appetite and denies weight loss.  She has no chest pain, shortness of breath, cough, or hemoptysis.  She denies any nausea, vomiting, constipation, or diarrhea.  She has no further melena or hematochezia.  She has no urinary complaints.  Patient offers no further specific complaints today.  REVIEW OF SYSTEMS:   Review of Systems  Constitutional: Negative.  Negative for fever, malaise/fatigue and weight loss.  Respiratory: Negative.  Negative for cough, hemoptysis and shortness of breath.   Cardiovascular: Negative.  Negative for chest pain and leg swelling.  Gastrointestinal: Negative.  Negative for abdominal pain, blood in stool and melena.  Genitourinary: Negative.  Negative for dysuria.  Musculoskeletal: Negative.  Negative for back pain.  Skin: Negative.  Negative for rash.  Neurological: Negative.  Negative for dizziness, seizures, weakness and headaches.  Psychiatric/Behavioral:  The patient is nervous/anxious.    As per HPI. Otherwise, a complete review of systems is negative.  PAST MEDICAL HISTORY: Past Medical History:  Diagnosis Date   Colon cancer ([place]) 08/15/2020   Family history of kidney cancer    Family history of leukemia    Family history of prostate cancer     PAST SURGICAL HISTORY: History reviewed. No pertinent surgical  history.  FAMILY HISTORY: Family History  Problem Relation Age of Onset   Breast cancer Cousin        pat cousin   Prostate cancer Mother 44   Kidney cancer Maternal Uncle 88   Leukemia Maternal Grandfather 90    ADVANCED DIRECTIVES (Y/N):  N  HEALTH MAINTENANCE: Social History   Tobacco Use   Smoking status: Never   Smokeless tobacco: Never  Substance Use Topics   Alcohol use: Never   Drug use: Never     Colonoscopy:  PAP:  Bone density:  Lipid panel:  No Known Allergies  Current Outpatient Medications  Medication Sig Dispense Refill   cetirizine (ZYRTEC) 10 MG tablet Take 1 tablet by mouth daily.     cholecalciferol (VITAMIN D3) 25 MCG (1000 UNIT) tablet Take 1,000 Units by mouth daily.     docusate sodium (COLACE) 100 MG capsule Take 100 mg by mouth 2 (two) times daily.     lidocaine-prilocaine (EMLA) cream Apply 1 application topically as needed. 30 g 3   Multiple Vitamin (MULTIVITAMIN) tablet Take 1 tablet by mouth daily.     Omega-3 Fatty Acids (FISH OIL) 1000 MG CAPS Take 1 capsule by mouth daily.     ondansetron (ZOFRAN) 8 MG tablet Take 1 tablet (8 mg total) by mouth 2 (two) times daily as needed for refractory nausea / vomiting. 60 tablet 1   polyethylene glycol (MIRALAX / GLYCOLAX) 17 g packet Take 17 g by mouth daily.     prochlorperazine (COMPAZINE) 10 MG tablet Take 1 tablet (10 mg total) by mouth every 6 (six) hours as needed (Nausea or vomiting). 60 tablet  1   No current facility-administered medications for this visit.   Facility-Administered Medications Ordered in Other Visits  Medication Dose Route Frequency Provider Last Rate Last Admin   dexamethasone (DECADRON) 10 mg in sodium chloride 0.9 % 50 mL IVPB  10 mg Intravenous Once Lloyd Huger, MD 204 mL/hr at 10/20/20 1006 10 mg at 10/20/20 1006   fluorouracil (ADRUCIL) 5,800 mg in sodium chloride 0.9 % 134 mL chemo infusion  2,400 mg/m2 (Treatment Plan Recorded) Intravenous 1 day or 1 dose  Lloyd Huger, MD       fluorouracil (ADRUCIL) chemo injection 950 mg  400 mg/m2 (Treatment Plan Recorded) Intravenous Once Lloyd Huger, MD       leucovorin 950 mg in dextrose 5 % 250 mL infusion  950 mg Intravenous Once Lloyd Huger, MD       oxaliplatin (ELOXATIN) 200 mg in dextrose 5 % 500 mL chemo infusion  83 mg/m2 (Treatment Plan Recorded) Intravenous Once Lloyd Huger, MD       sodium chloride flush (NS) 0.9 % injection 10 mL  10 mL Intracatheter PRN Lloyd Huger, MD        OBJECTIVE: Vitals:   10/20/20 0918  BP: (!) 158/99  Pulse: 67  Resp: 20  Temp: (!) 97.5 F (36.4 C)     Body mass index is 39.74 kg/m.    ECOG FS:0 - Asymptomatic  General: Well-developed, well-nourished, no acute distress. Eyes: Pink conjunctiva, anicteric sclera. HEENT: Normocephalic, moist mucous membranes. Lungs: No audible wheezing or coughing. Heart: Regular rate and rhythm. Abdomen: Soft, nontender, no obvious distention. Musculoskeletal: No edema, cyanosis, or clubbing. Neuro: Alert, answering all questions appropriately. Cranial nerves grossly intact. Skin: No rashes or petechiae noted. Psych: Normal affect.   LAB RESULTS:  Lab Results  Component Value Date   NA 136 10/20/2020   K 3.8 10/20/2020   CL 103 10/20/2020   CO2 26 10/20/2020   GLUCOSE 130 (H) 10/20/2020   BUN 15 10/20/2020   CREATININE 0.96 10/20/2020   CALCIUM 10.8 (H) 10/20/2020   PROT 7.1 10/20/2020   ALBUMIN 3.5 10/20/2020   AST 40 10/20/2020   ALT 38 10/20/2020   ALKPHOS 69 10/20/2020   BILITOT 0.7 10/20/2020   GFRNONAA >60 10/20/2020    Lab Results  Component Value Date   WBC 3.6 (L) 10/20/2020   NEUTROABS 1.4 (L) 10/20/2020   HGB 13.8 10/20/2020   HCT 41.2 10/20/2020   MCV 94.1 10/20/2020   PLT 142 (L) 10/20/2020     STUDIES: No results found.  ASSESSMENT: Colon cancer metastasized to liver.  PLAN:    1. Colon cancer metastasized to liver: Patient initially  presented to [facility] on on July 28, 2020 and underwent surgical intervention for metastatic colon cancer to the liver.  She was taken to the operating room by Cameron Sprang MD and Binnie Rail MD who performed a robot assisted laparoscopy, robot-assisted partial hepatectomy (Segment 4A/8), microwave ablation (x2, segments 7, 7/8), intraoperative ultrasound and robotic low anterior resection. Port has been placed.  Proceed with cycle 4 of 12 of adjuvant FOLFOX today.  Return to clinic in 2 days for pump removal and then in 2 weeks for further evaluation and consideration of cycle 5.   2.  Genetics referral: Negative. 3.  Polycythemia: Resolved. 4.  Hypercalcemia: Chronic and unchanged, monitor. 5.  Leukopenia: Mild, proceed with treatment as above. 6.  Thrombocytopenia: Mild, monitor. 7.  Anxiety: Patient was given a  prescription for 0.25 alprazolam as needed.   Patient expressed understanding and was in agreement with this plan. She also understands that She can call clinic at any time with any questions, concerns, or complaints.   Cancer Staging Colon cancer [facility]) Staging form: Colon and Rectum, AJCC 8th Edition - Clinical stage from 08/15/2020: Stage IVA (cTX, cNX, pM1a) - Signed by Lloyd Huger, MD on 08/15/2020   Lloyd Huger, MD   10/20/2020 10:17 AM

## 2020-10-20 ENCOUNTER — Other Ambulatory Visit: Payer: Self-pay | Admitting: *Deleted

## 2020-10-20 ENCOUNTER — Encounter: Payer: Self-pay | Admitting: Oncology

## 2020-10-20 ENCOUNTER — Inpatient Hospital Stay: Payer: BC Managed Care – PPO

## 2020-10-20 ENCOUNTER — Inpatient Hospital Stay (HOSPITAL_BASED_OUTPATIENT_CLINIC_OR_DEPARTMENT_OTHER): Payer: BC Managed Care – PPO | Admitting: Oncology

## 2020-10-20 VITALS — BP 158/99 | HR 67 | Temp 97.5°F | Resp 20 | Wt 269.1 lb

## 2020-10-20 DIAGNOSIS — Z5111 Encounter for antineoplastic chemotherapy: Secondary | ICD-10-CM | POA: Diagnosis not present

## 2020-10-20 DIAGNOSIS — C189 Malignant neoplasm of colon, unspecified: Secondary | ICD-10-CM

## 2020-10-20 LAB — CBC WITH DIFFERENTIAL/PLATELET
Abs Immature Granulocytes: 0.01 10*3/uL (ref 0.00–0.07)
Basophils Absolute: 0 10*3/uL (ref 0.0–0.1)
Basophils Relative: 1 %
Eosinophils Absolute: 0.1 10*3/uL (ref 0.0–0.5)
Eosinophils Relative: 4 %
HCT: 41.2 % (ref 36.0–46.0)
Hemoglobin: 13.8 g/dL (ref 12.0–15.0)
Immature Granulocytes: 0 %
Lymphocytes Relative: 36 %
Lymphs Abs: 1.3 10*3/uL (ref 0.7–4.0)
MCH: 31.5 pg (ref 26.0–34.0)
MCHC: 33.5 g/dL (ref 30.0–36.0)
MCV: 94.1 fL (ref 80.0–100.0)
Monocytes Absolute: 0.8 10*3/uL (ref 0.1–1.0)
Monocytes Relative: 22 %
Neutro Abs: 1.4 10*3/uL — ABNORMAL LOW (ref 1.7–7.7)
Neutrophils Relative %: 37 %
Platelets: 142 10*3/uL — ABNORMAL LOW (ref 150–400)
RBC: 4.38 MIL/uL (ref 3.87–5.11)
RDW: 14.8 % (ref 11.5–15.5)
WBC: 3.6 10*3/uL — ABNORMAL LOW (ref 4.0–10.5)
nRBC: 0 % (ref 0.0–0.2)

## 2020-10-20 LAB — COMPREHENSIVE METABOLIC PANEL
ALT: 38 U/L (ref 0–44)
AST: 40 U/L (ref 15–41)
Albumin: 3.5 g/dL (ref 3.5–5.0)
Alkaline Phosphatase: 69 U/L (ref 38–126)
Anion gap: 7 (ref 5–15)
BUN: 15 mg/dL (ref 6–20)
CO2: 26 mmol/L (ref 22–32)
Calcium: 10.8 mg/dL — ABNORMAL HIGH (ref 8.9–10.3)
Chloride: 103 mmol/L (ref 98–111)
Creatinine, Ser: 0.96 mg/dL (ref 0.44–1.00)
GFR, Estimated: >60 mL/min (ref >60)
Glucose, Bld: 130 mg/dL — ABNORMAL HIGH (ref 70–99)
Potassium: 3.8 mmol/L (ref 3.5–5.1)
Sodium: 136 mmol/L (ref 135–145)
Total Bilirubin: 0.7 mg/dL (ref 0.3–1.2)
Total Protein: 7.1 g/dL (ref 6.5–8.1)

## 2020-10-20 MED ORDER — OXALIPLATIN CHEMO INJECTION 100 MG/20ML
83.0000 mg/m2 | Freq: Once | INTRAVENOUS | Status: AC
  Administered 2020-10-20: 200 mg via INTRAVENOUS
  Filled 2020-10-20: qty 40

## 2020-10-20 MED ORDER — DEXTROSE 5 % IV SOLN
Freq: Once | INTRAVENOUS | Status: AC
  Filled 2020-10-20: qty 250

## 2020-10-20 MED ORDER — ALPRAZOLAM 0.25 MG PO TABS: 0.2500 mg | ORAL_TABLET | Freq: Every evening | ORAL | 0 refills | Status: AC | PRN

## 2020-10-20 MED ORDER — PALONOSETRON HCL INJECTION 0.25 MG/5ML
0.2500 mg | Freq: Once | INTRAVENOUS | Status: AC
  Administered 2020-10-20: 0.25 mg via INTRAVENOUS
  Filled 2020-10-20: qty 5

## 2020-10-20 MED ORDER — FLUOROURACIL CHEMO INJECTION 2.5 GM/50ML
400.0000 mg/m2 | Freq: Once | INTRAVENOUS | Status: AC
  Administered 2020-10-20: 950 mg via INTRAVENOUS
  Filled 2020-10-20: qty 19

## 2020-10-20 MED ORDER — SODIUM CHLORIDE 0.9 % IV SOLN
2400.0000 mg/m2 | INTRAVENOUS | Status: DC
  Administered 2020-10-20: 5800 mg via INTRAVENOUS
  Filled 2020-10-20: qty 116

## 2020-10-20 MED ORDER — SODIUM CHLORIDE 0.9 % IV SOLN
10.0000 mg | Freq: Once | INTRAVENOUS | Status: AC
  Administered 2020-10-20: 10 mg via INTRAVENOUS
  Filled 2020-10-20: qty 10

## 2020-10-20 MED ORDER — LEUCOVORIN CALCIUM INJECTION 350 MG
950.0000 mg | Freq: Once | INTRAVENOUS | Status: AC
  Administered 2020-10-20: 950 mg via INTRAVENOUS
  Filled 2020-10-20: qty 47.5

## 2020-10-20 MED ORDER — SODIUM CHLORIDE 0.9% FLUSH
10.0000 mL | INTRAVENOUS | Status: DC | PRN
  Filled 2020-10-20: qty 10

## 2020-10-20 NOTE — Progress Notes (Signed)
Patient here today for follow up regarding colon cancer, treatment consideration. Patient reports she feels more emotional and anxious.

## 2020-10-20 NOTE — Patient Instructions (Signed)
[facility]  Discharge Instructions: Thank you for choosing [facility] to provide your oncology and hematology care.  If you have a lab appointment with the [facility], please go directly to the [facility] and check in at the registration area.  Wear comfortable clothing and clothing appropriate for easy access to any Portacath or PICC line.   We strive to give you quality time with your provider. You may need to reschedule your appointment if you arrive late (15 or more minutes).  Arriving late affects you and other patients whose appointments are after yours.  Also, if you miss three or more appointments without notifying the office, you may be dismissed from the clinic at the provider's discretion.      For prescription refill requests, have your pharmacy contact our office and allow 72 hours for refills to be completed.    Today you received the following chemotherapy and/or immunotherapy agents - oxaliplatin, fluorouracil      To help prevent nausea and vomiting after your treatment, we encourage you to take your nausea medication as directed.  BELOW ARE SYMPTOMS THAT SHOULD BE REPORTED IMMEDIATELY: *FEVER GREATER THAN 100.4 F (38 C) OR HIGHER *CHILLS OR SWEATING *NAUSEA AND VOMITING THAT IS NOT CONTROLLED WITH YOUR NAUSEA MEDICATION *UNUSUAL SHORTNESS OF BREATH *UNUSUAL BRUISING OR BLEEDING *URINARY PROBLEMS (pain or burning when urinating, or frequent urination) *BOWEL PROBLEMS (unusual diarrhea, constipation, pain near the anus) TENDERNESS IN MOUTH AND THROAT WITH OR WITHOUT PRESENCE OF ULCERS (sore throat, sores in mouth, or a toothache) UNUSUAL RASH, SWELLING OR PAIN  UNUSUAL VAGINAL DISCHARGE OR ITCHING   Items with * indicate a potential emergency and should be followed up as soon as possible or go to the Emergency Department if any problems should occur.  Please show the CHEMOTHERAPY ALERT CARD or IMMUNOTHERAPY ALERT  CARD at check-in to the Emergency Department and triage nurse.  Should you have questions after your visit or need to cancel or reschedule your appointment, please contact [facility]  [phone] and follow the prompts.  Office hours are 8:00 a.m. to 4:30 p.m. Monday - Friday. Please note that voicemails left after 4:00 p.m. may not be returned until the following business day.  We are closed weekends and major holidays. You have access to a nurse at all times for urgent questions. Please call the main number to the clinic [phone] and follow the prompts.  For any non-urgent questions, you may also contact your provider using MyChart. We now offer e-Visits for anyone 62 and older to request care online for non-urgent symptoms. For details visit [email or web address].   Also download the MyChart app! Go to the app store, search "MyChart", open the app, select , and log in with your MyChart username and password.  Due to Covid, a mask is required upon entering the hospital/clinic. If you do not have a mask, one will be given to you upon arrival. For doctor visits, patients may have 1 support person aged 70 or older with them. For treatment visits, patients cannot have anyone with them due to current Covid guidelines and our immunocompromised population.   Oxaliplatin Injection What is this medication? OXALIPLATIN (ox AL i PLA tin) is a chemotherapy drug. It targets fast dividing cells, like cancer cells, and causes these cells to die. This medicine is usedto treat cancers of the colon and rectum, and many other cancers. This medicine may be used for other purposes;  ask your health care provider orpharmacist if you have questions. COMMON BRAND NAME(S): Eloxatin What should I tell my care team before I take this medication? They need to know if you have any of these conditions: heart disease history of irregular heartbeat liver disease low blood  counts, like white cells, platelets, or red blood cells lung or breathing disease, like asthma take medicines that treat or prevent blood clots tingling of the fingers or toes, or other nerve disorder an unusual or allergic reaction to oxaliplatin, other chemotherapy, other medicines, foods, dyes, or preservatives pregnant or trying to get pregnant breast-feeding How should I use this medication? This drug is given as an infusion into a vein. It is administered in a hospitalor clinic by a specially trained health care professional. Talk to your pediatrician regarding the use of this medicine in children.Special care may be needed. Overdosage: If you think you have taken too much of this medicine contact apoison control center or emergency room at once. NOTE: This medicine is only for you. Do not share this medicine with others. What if I miss a dose? It is important not to miss a dose. Call your doctor or health careprofessional if you are unable to keep an appointment. What may interact with this medication? Do not take this medicine with any of the following medications: cisapride dronedarone pimozide thioridazine This medicine may also interact with the following medications: aspirin and aspirin-like medicines certain medicines that treat or prevent blood clots like warfarin, apixaban, dabigatran, and rivaroxaban cisplatin cyclosporine diuretics medicines for infection like acyclovir, adefovir, amphotericin B, bacitracin, cidofovir, foscarnet, ganciclovir, gentamicin, pentamidine, vancomycin NSAIDs, medicines for pain and inflammation, like ibuprofen or naproxen other medicines that prolong the QT interval (an abnormal heart rhythm) pamidronate zoledronic acid This list may not describe all possible interactions. Give your health care provider a list of all the medicines, herbs, non-prescription drugs, or dietary supplements you use. Also tell them if you smoke, drink alcohol, or  use illegaldrugs. Some items may interact with your medicine. What should I watch for while using this medication? Your condition will be monitored carefully while you are receiving thismedicine. You may need blood work done while you are taking this medicine. This medicine may make you feel generally unwell. This is not uncommon as chemotherapy can affect healthy cells as well as cancer cells. Report any side effects. Continue your course of treatment even though you feel ill unless yourhealthcare professional tells you to stop. This medicine can make you more sensitive to cold. Do not drink cold drinks or use ice. Cover exposed skin before coming in contact with cold temperatures or cold objects. When out in cold weather wear warm clothing and cover your mouth and nose to warm the air that goes into your lungs. Tell your doctor if you getsensitive to the cold. Do not become pregnant while taking this medicine or for 9 months after stopping it. Women should inform their health care professional if they wish to become pregnant or think they might be pregnant. Men should not father a child while taking this medicine and for 6 months after stopping it. There is potential for serious side effects to an unborn child. Talk to your health careprofessional for more information. Do not breast-feed a child while taking this medicine or for 3 months afterstopping it. This medicine has caused ovarian failure in some women. This medicine may make it more difficult to get pregnant. Talk to your health care professional if [place] concerned about  your fertility. This medicine has caused decreased sperm counts in some men. This may make it more difficult to father a child. Talk to your health care professional if [place] concerned about your fertility. This medicine may increase your risk of getting an infection. Call your health care professional for advice if you get a fever, chills, or sore throat, or other symptoms of  a cold or flu. Do not treat yourself. Try to avoid beingaround people who are sick. Avoid taking medicines that contain aspirin, acetaminophen, ibuprofen, naproxen, or ketoprofen unless instructed by your health care professional.These medicines may hide a fever. Be careful brushing or flossing your teeth or using a toothpick because you may get an infection or bleed more easily. If you have any dental work done, Primary school teacher you are receiving this medicine. What side effects may I notice from receiving this medication? Side effects that you should report to your doctor or health care professionalas soon as possible: allergic reactions like skin rash, itching or hives, swelling of the face, lips, or tongue breathing problems cough low blood counts - this medicine may decrease the number of white blood cells, red blood cells, and platelets. You may be at increased risk for infections and bleeding nausea, vomiting pain, redness, or irritation at site where injected pain, tingling, numbness in the hands or feet signs and symptoms of bleeding such as bloody or black, tarry stools; red or dark brown urine; spitting up blood or brown material that looks like coffee grounds; red spots on the skin; unusual bruising or bleeding from the eyes, gums, or nose signs and symptoms of a dangerous change in heartbeat or heart rhythm like chest pain; dizziness; fast, irregular heartbeat; palpitations; feeling faint or lightheaded; falls signs and symptoms of infection like fever; chills; cough; sore throat; pain or trouble passing urine signs and symptoms of liver injury like dark yellow or brown urine; general ill feeling or flu-like symptoms; light-colored stools; loss of appetite; nausea; right upper belly pain; unusually weak or tired; yellowing of the eyes or skin signs and symptoms of low red blood cells or anemia such as unusually weak or tired; feeling faint or lightheaded; falls signs and symptoms of  muscle injury like dark urine; trouble passing urine or change in the amount of urine; unusually weak or tired; muscle pain; back pain Side effects that usually do not require medical attention (report to yourdoctor or health care professional if they continue or are bothersome): changes in taste diarrhea gas hair loss loss of appetite mouth sores This list may not describe all possible side effects. Call your doctor for medical advice about side effects. You may report side effects to FDA at1-800-FDA-1088. Where should I keep my medication? This drug is given in a hospital or clinic and will not be stored at home. NOTE: This sheet is a summary. It may not cover all possible information. If you have questions about this medicine, talk to your doctor, pharmacist, orhealth care provider.  2022 Elsevier/Gold Standard (2018-09-12 12:20:35)  Fluorouracil, 5-FU injection What is this medication? FLUOROURACIL, 5-FU (flure oh YOOR a sil) is a chemotherapy drug. It slows the growth of cancer cells. This medicine is used to treat many types of cancer like breast cancer, colon or rectal cancer, pancreatic cancer, and stomachcancer. This medicine may be used for other purposes; ask your health care provider orpharmacist if you have questions. COMMON BRAND NAME(S): Adrucil What should I tell my care team before I take this medication? They need  to know if you have any of these conditions: blood disorders dihydropyrimidine dehydrogenase (DPD) deficiency infection (especially a virus infection such as chickenpox, cold sores, or herpes) kidney disease liver disease malnourished, poor nutrition recent or ongoing radiation therapy an unusual or allergic reaction to fluorouracil, other chemotherapy, other medicines, foods, dyes, or preservatives pregnant or trying to get pregnant breast-feeding How should I use this medication? This drug is given as an infusion or injection into a vein. It is  administeredin a hospital or clinic by a specially trained health care professional. Talk to your pediatrician regarding the use of this medicine in children.Special care may be needed. Overdosage: If you think you have taken too much of this medicine contact apoison control center or emergency room at once. NOTE: This medicine is only for you. Do not share this medicine with others. What if I miss a dose? It is important not to miss your dose. Call your doctor or health careprofessional if you are unable to keep an appointment. What may interact with this medication? Do not take this medicine with any of the following medications: live virus vaccines This medicine may also interact with the following medications: medicines that treat or prevent blood clots like warfarin, enoxaparin, and dalteparin This list may not describe all possible interactions. Give your health care provider a list of all the medicines, herbs, non-prescription drugs, or dietary supplements you use. Also tell them if you smoke, drink alcohol, or use illegaldrugs. Some items may interact with your medicine. What should I watch for while using this medication? Visit your doctor for checks on your progress. This drug may make you feel generally unwell. This is not uncommon, as chemotherapy can affect healthy cells as well as cancer cells. Report any side effects. Continue your course oftreatment even though you feel ill unless your doctor tells you to stop. In some cases, you may be given additional medicines to help with side effects.Follow all directions for their use. Call your doctor or health care professional for advice if you get a fever, chills or sore throat, or other symptoms of a cold or flu. Do not treat yourself. This drug decreases your body's ability to fight infections. Try toavoid being around people who are sick. This medicine may increase your risk to bruise or bleed. Call your doctor orhealth care professional  if you notice any unusual bleeding. Be careful brushing and flossing your teeth or using a toothpick because you may get an infection or bleed more easily. If you have any dental work done,tell your dentist you are receiving this medicine. Avoid taking products that contain aspirin, acetaminophen, ibuprofen, naproxen, or ketoprofen unless instructed by your doctor. These medicines may hide afever. Do not become pregnant while taking this medicine. Women should inform their doctor if they wish to become pregnant or think they might be pregnant. There is a potential for serious side effects to an unborn child. Talk to your health care professional or pharmacist for more information. Do not breast-feed aninfant while taking this medicine. Men should inform their doctor if they wish to father a child. This medicinemay lower sperm counts. Do not treat diarrhea with over the counter products. Contact your doctor ifyou have diarrhea that lasts more than 2 days or if it is severe and watery. This medicine can make you more sensitive to the sun. Keep out of the sun. If you cannot avoid being in the sun, wear protective clothing and use sunscreen.Do not use sun lamps or tanning  beds/booths. What side effects may I notice from receiving this medication? Side effects that you should report to your doctor or health care professionalas soon as possible: allergic reactions like skin rash, itching or hives, swelling of the face, lips, or tongue low blood counts - this medicine may decrease the number of white blood cells, red blood cells and platelets. You may be at increased risk for infections and bleeding. signs of infection - fever or chills, cough, sore throat, pain or difficulty passing urine signs of decreased platelets or bleeding - bruising, pinpoint red spots on the skin, black, tarry stools, blood in the urine signs of decreased red blood cells - unusually weak or tired, fainting spells,  lightheadedness breathing problems changes in vision chest pain mouth sores nausea and vomiting pain, swelling, redness at site where injected pain, tingling, numbness in the hands or feet redness, swelling, or sores on hands or feet stomach pain unusual bleeding Side effects that usually do not require medical attention (report to yourdoctor or health care professional if they continue or are bothersome): changes in finger or toe nails diarrhea dry or itchy skin hair loss headache loss of appetite sensitivity of eyes to the light stomach upset unusually teary eyes This list may not describe all possible side effects. Call your doctor for medical advice about side effects. You may report side effects to FDA at1-800-FDA-1088. Where should I keep my medication? This drug is given in a hospital or clinic and will not be stored at home. NOTE: This sheet is a summary. It may not cover all possible information. If you have questions about this medicine, talk to your doctor, pharmacist, orhealth care provider.  2022 Elsevier/Gold Standard (2019-03-26 15:00:03)

## 2020-10-22 ENCOUNTER — Inpatient Hospital Stay: Payer: BC Managed Care – PPO

## 2020-10-22 DIAGNOSIS — C189 Malignant neoplasm of colon, unspecified: Secondary | ICD-10-CM

## 2020-10-22 DIAGNOSIS — Z5111 Encounter for antineoplastic chemotherapy: Secondary | ICD-10-CM | POA: Diagnosis not present

## 2020-10-22 MED ORDER — SODIUM CHLORIDE 0.9% FLUSH
10.0000 mL | INTRAVENOUS | Status: DC | PRN
  Administered 2020-10-22: 10 mL
  Filled 2020-10-22: qty 10

## 2020-10-22 MED ORDER — HEPARIN SOD (PORK) LOCK FLUSH 100 UNIT/ML IV SOLN
INTRAVENOUS | Status: AC
  Filled 2020-10-22: qty 5

## 2020-10-22 MED ORDER — HEPARIN SOD (PORK) LOCK FLUSH 100 UNIT/ML IV SOLN
500.0000 [IU] | Freq: Once | INTRAVENOUS | Status: AC | PRN
  Administered 2020-10-22: 500 [IU]
  Filled 2020-10-22: qty 5

## 2020-10-22 NOTE — Progress Notes (Signed)
Pt reports tolerating her infusion pump well at home.  Pt left infusion suite stable and ambulatory.

## 2020-10-22 NOTE — Patient Instructions (Signed)
[facility] ONCOLOGY  Discharge Instructions: Thank you for choosing [facility] to provide your oncology and hematology care.  If you have a lab appointment with the [facility], please go directly to the [facility] and check in at the registration area.  Wear comfortable clothing and clothing appropriate for easy access to any Portacath or PICC line.   We strive to give you quality time with your provider. You may need to reschedule your appointment if you arrive late (15 or more minutes).  Arriving late affects you and other patients whose appointments are after yours.  Also, if you miss three or more appointments without notifying the office, you may be dismissed from the clinic at the provider's discretion.      For prescription refill requests, have your pharmacy contact our office and allow 72 hours for refills to be completed.    Today you received the following chemotherapy and/or immunotherapy agents 5FU      To help prevent nausea and vomiting after your treatment, we encourage you to take your nausea medication as directed.  BELOW ARE SYMPTOMS THAT SHOULD BE REPORTED IMMEDIATELY: *FEVER GREATER THAN 100.4 F (38 C) OR HIGHER *CHILLS OR SWEATING *NAUSEA AND VOMITING THAT IS NOT CONTROLLED WITH YOUR NAUSEA MEDICATION *UNUSUAL SHORTNESS OF BREATH *UNUSUAL BRUISING OR BLEEDING *URINARY PROBLEMS (pain or burning when urinating, or frequent urination) *BOWEL PROBLEMS (unusual diarrhea, constipation, pain near the anus) TENDERNESS IN MOUTH AND THROAT WITH OR WITHOUT PRESENCE OF ULCERS (sore throat, sores in mouth, or a toothache) UNUSUAL RASH, SWELLING OR PAIN  UNUSUAL VAGINAL DISCHARGE OR ITCHING   Items with * indicate a potential emergency and should be followed up as soon as possible or go to the Emergency Department if any problems should occur.  Please show the CHEMOTHERAPY ALERT CARD or IMMUNOTHERAPY ALERT CARD at check-in to the  Emergency Department and triage nurse.  Should you have questions after your visit or need to cancel or reschedule your appointment, please contact [facility]  [phone] and follow the prompts.  Office hours are 8:00 a.m. to 4:30 p.m. Monday - Friday. Please note that voicemails left after 4:00 p.m. may not be returned until the following business day.  We are closed weekends and major holidays. You have access to a nurse at all times for urgent questions. Please call the main number to the clinic [phone] and follow the prompts.  For any non-urgent questions, you may also contact your provider using MyChart. We now offer e-Visits for anyone 6 and older to request care online for non-urgent symptoms. For details visit [email or web address].   Also download the MyChart app! Go to the app store, search "MyChart", open the app, select [facility], and log in with your MyChart username and password.  Due to Covid, a mask is required upon entering the hospital/clinic. If you do not have a mask, one will be given to you upon arrival. For doctor visits, patients may have 1 support person aged 58 or older with them. For treatment visits, patients cannot have anyone with them due to current Covid guidelines and our immunocompromised population.

## 2020-11-03 ENCOUNTER — Encounter: Payer: Self-pay | Admitting: Oncology

## 2020-11-03 ENCOUNTER — Inpatient Hospital Stay: Payer: BC Managed Care – PPO

## 2020-11-03 ENCOUNTER — Inpatient Hospital Stay (HOSPITAL_BASED_OUTPATIENT_CLINIC_OR_DEPARTMENT_OTHER): Payer: BC Managed Care – PPO | Admitting: Oncology

## 2020-11-03 VITALS — BP 155/99 | HR 71 | Temp 97.5°F | Resp 16 | Wt 270.0 lb

## 2020-11-03 DIAGNOSIS — C189 Malignant neoplasm of colon, unspecified: Secondary | ICD-10-CM | POA: Diagnosis not present

## 2020-11-03 DIAGNOSIS — Z5111 Encounter for antineoplastic chemotherapy: Secondary | ICD-10-CM | POA: Diagnosis not present

## 2020-11-03 LAB — CBC WITH DIFFERENTIAL/PLATELET
Abs Immature Granulocytes: 0 10*3/uL (ref 0.00–0.07)
Basophils Absolute: 0 10*3/uL (ref 0.0–0.1)
Basophils Relative: 1 %
Eosinophils Absolute: 0.3 10*3/uL (ref 0.0–0.5)
Eosinophils Relative: 8 %
HCT: 40.5 % (ref 36.0–46.0)
Hemoglobin: 13.7 g/dL (ref 12.0–15.0)
Immature Granulocytes: 0 %
Lymphocytes Relative: 38 %
Lymphs Abs: 1.4 10*3/uL (ref 0.7–4.0)
MCH: 32.5 pg (ref 26.0–34.0)
MCHC: 33.8 g/dL (ref 30.0–36.0)
MCV: 96.2 fL (ref 80.0–100.0)
Monocytes Absolute: 0.7 10*3/uL (ref 0.1–1.0)
Monocytes Relative: 19 %
Neutro Abs: 1.2 10*3/uL — ABNORMAL LOW (ref 1.7–7.7)
Neutrophils Relative %: 34 %
Platelets: 134 10*3/uL — ABNORMAL LOW (ref 150–400)
RBC: 4.21 MIL/uL (ref 3.87–5.11)
RDW: 15.3 % (ref 11.5–15.5)
WBC: 3.6 10*3/uL — ABNORMAL LOW (ref 4.0–10.5)
nRBC: 0 % (ref 0.0–0.2)

## 2020-11-03 LAB — COMPREHENSIVE METABOLIC PANEL
ALT: 19 U/L (ref 0–44)
AST: 31 U/L (ref 15–41)
Albumin: 3.5 g/dL (ref 3.5–5.0)
Alkaline Phosphatase: 75 U/L (ref 38–126)
Anion gap: 8 (ref 5–15)
BUN: 13 mg/dL (ref 6–20)
CO2: 25 mmol/L (ref 22–32)
Calcium: 10.8 mg/dL — ABNORMAL HIGH (ref 8.9–10.3)
Chloride: 103 mmol/L (ref 98–111)
Creatinine, Ser: 0.93 mg/dL (ref 0.44–1.00)
GFR, Estimated: >60 mL/min (ref >60)
Glucose, Bld: 120 mg/dL — ABNORMAL HIGH (ref 70–99)
Potassium: 3.8 mmol/L (ref 3.5–5.1)
Sodium: 136 mmol/L (ref 135–145)
Total Bilirubin: 0.6 mg/dL (ref 0.3–1.2)
Total Protein: 7 g/dL (ref 6.5–8.1)

## 2020-11-03 MED ORDER — SODIUM CHLORIDE 0.9% FLUSH
10.0000 mL | Freq: Once | INTRAVENOUS | Status: AC
  Administered 2020-11-03: 10 mL via INTRAVENOUS
  Filled 2020-11-03: qty 10

## 2020-11-03 MED ORDER — PALONOSETRON HCL INJECTION 0.25 MG/5ML
0.2500 mg | Freq: Once | INTRAVENOUS | Status: AC
  Administered 2020-11-03: 0.25 mg via INTRAVENOUS
  Filled 2020-11-03: qty 5

## 2020-11-03 MED ORDER — OXALIPLATIN CHEMO INJECTION 100 MG/20ML
200.0000 mg | Freq: Once | INTRAVENOUS | Status: AC
  Administered 2020-11-03: 200 mg via INTRAVENOUS
  Filled 2020-11-03: qty 40

## 2020-11-03 MED ORDER — SODIUM CHLORIDE 0.9 % IV SOLN
10.0000 mg | Freq: Once | INTRAVENOUS | Status: AC
  Administered 2020-11-03: 10 mg via INTRAVENOUS
  Filled 2020-11-03: qty 10

## 2020-11-03 MED ORDER — DEXTROSE 5 % IV SOLN
Freq: Once | INTRAVENOUS | Status: AC
  Filled 2020-11-03: qty 250

## 2020-11-03 MED ORDER — SODIUM CHLORIDE 0.9 % IV SOLN
2400.0000 mg/m2 | INTRAVENOUS | Status: DC
  Administered 2020-11-03: 5800 mg via INTRAVENOUS
  Filled 2020-11-03: qty 116

## 2020-11-03 MED ORDER — LEUCOVORIN CALCIUM INJECTION 350 MG
950.0000 mg | Freq: Once | INTRAVENOUS | Status: AC
  Administered 2020-11-03: 950 mg via INTRAVENOUS
  Filled 2020-11-03: qty 47.5

## 2020-11-03 MED ORDER — FLUOROURACIL CHEMO INJECTION 2.5 GM/50ML
400.0000 mg/m2 | Freq: Once | INTRAVENOUS | Status: AC
  Administered 2020-11-03: 950 mg via INTRAVENOUS
  Filled 2020-11-03: qty 19

## 2020-11-03 NOTE — Progress Notes (Signed)
Sherri Howell  [phone] [phone]) [phone]  ID: Sherri Howell OB: 11/26/1970  MR#: [id number]  OAC#:[id number]  Patient Care Team: Kirk Ruths, MD as PCP - General (Internal Medicine)    CHIEF COMPLAINT: Colon cancer metastasized to liver.  INTERVAL HISTORY: Patient returns to clinic today for further evaluation and consideration of cycle 5 of FOLFOX.  She describes a "scaly" feeling in her mouth after her last treatment, but otherwise feels well and is tolerating her treatments without significant side effects.  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 She has no other neurologic complaints. She denies any recent fevers or illnesses.  She has a fair appetite and denies weight loss.  She has no chest pain, shortness of breath, cough, or hemoptysis.  She denies any nausea, vomiting, constipation, or diarrhea.  She has no further melena or hematochezia.  She has no urinary complaints.  Patient offers no further specific complaints today.  REVIEW OF SYSTEMS:   Review of Systems  Constitutional: Negative.  Negative for fever, malaise/fatigue and weight loss.  Respiratory: Negative.  Negative for cough, hemoptysis and shortness of breath.   Cardiovascular: Negative.  Negative for chest pain and leg swelling.  Gastrointestinal: Negative.  Negative for abdominal pain, blood in stool and melena.  Genitourinary: Negative.  Negative for dysuria.  Musculoskeletal: Negative.  Negative for back pain.  Skin: Negative.  Negative for rash.  Neurological: Negative.  Negative for dizziness, seizures, weakness and headaches.  Psychiatric/Behavioral: Negative.  The patient is not nervous/anxious.    As per HPI. Otherwise, a complete review of systems is negative.  PAST MEDICAL HISTORY: Past Medical History:  Diagnosis Date   Colon cancer ([facility] [place]) 08/15/2020   Family history of kidney cancer    Family history of leukemia    Family history of prostate  cancer     PAST SURGICAL HISTORY: History reviewed. No pertinent surgical history.  FAMILY HISTORY: Family History  Problem Relation Age of Onset   Breast cancer Cousin        pat cousin   Prostate cancer Mother 88   Kidney cancer Maternal Uncle 40   Leukemia Maternal Grandfather 59    ADVANCED DIRECTIVES (Y/N):  N  HEALTH MAINTENANCE: Social History   Tobacco Use   Smoking status: Never   Smokeless tobacco: Never  Substance Use Topics   Alcohol use: Never   Drug use: Never     Colonoscopy:  PAP:  Bone density:  Lipid panel:  No Known Allergies  Current Outpatient Medications  Medication Sig Dispense Refill   ALPRAZolam (XANAX) 0.25 MG tablet Take 1 tablet (0.25 mg total) by mouth at bedtime as needed for anxiety. 30 tablet 0   cetirizine (ZYRTEC) 10 MG tablet Take 1 tablet by mouth daily.     cholecalciferol (VITAMIN D3) 25 MCG (1000 UNIT) tablet Take 1,000 Units by mouth daily.     docusate sodium (COLACE) 100 MG capsule Take 100 mg by mouth 2 (two) times daily.     lidocaine-prilocaine (EMLA) cream Apply 1 application topically as needed. 30 g 3   Multiple Vitamin (MULTIVITAMIN) tablet Take 1 tablet by mouth daily.     Omega-3 Fatty Acids (FISH OIL) 1000 MG CAPS Take 1 capsule by mouth daily.     ondansetron (ZOFRAN) 8 MG tablet Take 1 tablet (8 mg total) by mouth 2 (two) times daily as needed for refractory nausea / vomiting. 60 tablet 1   polyethylene glycol (MIRALAX / GLYCOLAX) 17 g  packet Take 17 g by mouth daily.     prochlorperazine (COMPAZINE) 10 MG tablet Take 1 tablet (10 mg total) by mouth every 6 (six) hours as needed (Nausea or vomiting). 60 tablet 1   No current facility-administered medications for this visit.   Facility-Administered Medications Ordered in Other Visits  Medication Dose Route Frequency Provider Last Rate Last Admin   fluorouracil (ADRUCIL) 5,800 mg in sodium chloride 0.9 % 134 mL chemo infusion  2,400 mg/m2 (Treatment Plan  Recorded) Intravenous 1 day or 1 dose Lloyd Huger, MD   5,800 mg at 11/03/20 1340    OBJECTIVE: Vitals:   11/03/20 0923  BP: (!) 155/99  Pulse: 71  Resp: 16  Temp: (!) 97.5 F (36.4 C)     Body mass index is 39.87 kg/m.    ECOG FS:0 - Asymptomatic  General: Well-developed, well-nourished, no acute distress. Eyes: Pink conjunctiva, anicteric sclera. HEENT: Normocephalic, moist mucous membranes. Lungs: No audible wheezing or coughing. Heart: Regular rate and rhythm. Abdomen: Soft, nontender, no obvious distention. Musculoskeletal: No edema, cyanosis, or clubbing. Neuro: Alert, answering all questions appropriately. Cranial nerves grossly intact. Skin: No rashes or petechiae noted. Psych: Normal affect.   LAB RESULTS:  Lab Results  Component Value Date   NA 136 11/03/2020   K 3.8 11/03/2020   CL 103 11/03/2020   CO2 25 11/03/2020   GLUCOSE 120 (H) 11/03/2020   BUN 13 11/03/2020   CREATININE 0.93 11/03/2020   CALCIUM 10.8 (H) 11/03/2020   PROT 7.0 11/03/2020   ALBUMIN 3.5 11/03/2020   AST 31 11/03/2020   ALT 19 11/03/2020   ALKPHOS 75 11/03/2020   BILITOT 0.6 11/03/2020   GFRNONAA >60 11/03/2020    Lab Results  Component Value Date   WBC 3.6 (L) 11/03/2020   NEUTROABS 1.2 (L) 11/03/2020   HGB 13.7 11/03/2020   HCT 40.5 11/03/2020   MCV 96.2 11/03/2020   PLT 134 (L) 11/03/2020     STUDIES: No results found.  ASSESSMENT: Colon cancer metastasized to liver.  PLAN:    1. Colon cancer metastasized to liver: Patient initially presented to [facility] on on July 28, 2020 and underwent surgical intervention for metastatic colon cancer to the liver.  She was taken to the operating room by Cameron Sprang MD and Binnie Rail MD who performed a robot assisted laparoscopy, robot-assisted partial hepatectomy (Segment 4A/8), microwave ablation (x2, segments 7, 7/8), intraoperative ultrasound and robotic low anterior resection. Port has been  placed.  Proceed with cycle 5 of 12 of adjuvant FOLFOX today.  Return to clinic in 2 days for pump removal and then in 2 weeks for further evaluation and consideration of cycle 6.   2.  Genetics referral: Negative. 3.  Polycythemia: Resolved. 4.  Hypercalcemia: Chronic and unchanged.  Patient's calcium is 10.8 today. 5.  Leukopenia: Chronic and unchanged.  Proceed with treatment as above. 6.  Thrombocytopenia: Mild, monitor. 7.  Anxiety: Continue 0.25 alprazolam as needed. 8.  "Scaly" oropharynx: Possibly thrush?  Patient was given a prescription for Magic mouthwash today.   Patient expressed understanding and was in agreement with this plan. She also understands that She can call clinic at any time with any questions, concerns, or complaints.   Cancer Staging Colon cancer [facility]) Staging form: Colon and Rectum, AJCC 8th Edition - Clinical stage from 08/15/2020: Stage IVA (cTX, cNX, pM1a) - Signed by Lloyd Huger, MD on 08/15/2020   Lloyd Huger, MD   11/03/2020 4:26  PM

## 2020-11-03 NOTE — Progress Notes (Signed)
Patient notices that her tongue feels "burnt" and roof of her mouth feels "scaly" after treatments.

## 2020-11-03 NOTE — Patient Instructions (Signed)
[facility] ONCOLOGY  Discharge Instructions: Thank you for choosing [facility] to provide your oncology and hematology care.  If you have a lab appointment with the [facility], please go directly to the [facility] and check in at the registration area.  Wear comfortable clothing and clothing appropriate for easy access to any Portacath or PICC line.   We strive to give you quality time with your provider. You may need to reschedule your appointment if you arrive late (15 or more minutes).  Arriving late affects you and other patients whose appointments are after yours.  Also, if you miss three or more appointments without notifying the office, you may be dismissed from the clinic at the provider's discretion.      For prescription refill requests, have your pharmacy contact our office and allow 72 hours for refills to be completed.    Today you received the following chemotherapy and/or immunotherapy agents Oxaliplatin, Leucovorin, & 5FU      To help prevent nausea and vomiting after your treatment, we encourage you to take your nausea medication as directed.  BELOW ARE SYMPTOMS THAT SHOULD BE REPORTED IMMEDIATELY: *FEVER GREATER THAN 100.4 F (38 C) OR HIGHER *CHILLS OR SWEATING *NAUSEA AND VOMITING THAT IS NOT CONTROLLED WITH YOUR NAUSEA MEDICATION *UNUSUAL SHORTNESS OF BREATH *UNUSUAL BRUISING OR BLEEDING *URINARY PROBLEMS (pain or burning when urinating, or frequent urination) *BOWEL PROBLEMS (unusual diarrhea, constipation, pain near the anus) TENDERNESS IN MOUTH AND THROAT WITH OR WITHOUT PRESENCE OF ULCERS (sore throat, sores in mouth, or a toothache) UNUSUAL RASH, SWELLING OR PAIN  UNUSUAL VAGINAL DISCHARGE OR ITCHING   Items with * indicate a potential emergency and should be followed up as soon as possible or go to the Emergency Department if any problems should occur.  Please show the CHEMOTHERAPY ALERT CARD or IMMUNOTHERAPY  ALERT CARD at check-in to the Emergency Department and triage nurse.  Should you have questions after your visit or need to cancel or reschedule your appointment, please contact [facility]  [phone] and follow the prompts.  Office hours are 8:00 a.m. to 4:30 p.m. Monday - Friday. Please note that voicemails left after 4:00 p.m. may not be returned until the following business day.  We are closed weekends and major holidays. You have access to a nurse at all times for urgent questions. Please call the main number to the clinic [phone] and follow the prompts.  For any non-urgent questions, you may also contact your provider using MyChart. We now offer e-Visits for anyone 97 and older to request care online for non-urgent symptoms. For details visit [email or web address].   Also download the MyChart app! Go to the app store, search "MyChart", open the app, select [facility], and log in with your MyChart username and password.  Due to Covid, a mask is required upon entering the hospital/clinic. If you do not have a mask, one will be given to you upon arrival. For doctor visits, patients may have 1 support person aged 70 or older with them. For treatment visits, patients cannot have anyone with them due to current Covid guidelines and our immunocompromised population.

## 2020-11-05 ENCOUNTER — Inpatient Hospital Stay: Payer: BC Managed Care – PPO

## 2020-11-05 DIAGNOSIS — C189 Malignant neoplasm of colon, unspecified: Secondary | ICD-10-CM

## 2020-11-05 DIAGNOSIS — Z5111 Encounter for antineoplastic chemotherapy: Secondary | ICD-10-CM | POA: Diagnosis not present

## 2020-11-05 MED ORDER — HEPARIN SOD (PORK) LOCK FLUSH 100 UNIT/ML IV SOLN
500.0000 [IU] | Freq: Once | INTRAVENOUS | Status: AC | PRN
  Administered 2020-11-05: 500 [IU]
  Filled 2020-11-05: qty 5

## 2020-11-05 MED ORDER — HEPARIN SOD (PORK) LOCK FLUSH 100 UNIT/ML IV SOLN
INTRAVENOUS | Status: AC
  Filled 2020-11-05: qty 5

## 2020-11-05 MED ORDER — SODIUM CHLORIDE 0.9% FLUSH
10.0000 mL | INTRAVENOUS | Status: DC | PRN
  Administered 2020-11-05: 10 mL
  Filled 2020-11-05: qty 10

## 2020-11-10 ENCOUNTER — Ambulatory Visit: Payer: BC Managed Care – PPO | Admitting: Oncology

## 2020-11-10 ENCOUNTER — Ambulatory Visit: Payer: BC Managed Care – PPO

## 2020-11-10 ENCOUNTER — Other Ambulatory Visit: Payer: BC Managed Care – PPO

## 2020-11-12 ENCOUNTER — Inpatient Hospital Stay: Payer: BC Managed Care – PPO

## 2020-11-17 ENCOUNTER — Inpatient Hospital Stay: Payer: BC Managed Care – PPO

## 2020-11-17 ENCOUNTER — Inpatient Hospital Stay (HOSPITAL_BASED_OUTPATIENT_CLINIC_OR_DEPARTMENT_OTHER): Payer: BC Managed Care – PPO | Admitting: Nurse Practitioner

## 2020-11-17 ENCOUNTER — Encounter: Payer: Self-pay | Admitting: Nurse Practitioner

## 2020-11-17 ENCOUNTER — Inpatient Hospital Stay: Payer: BC Managed Care – PPO | Attending: Oncology

## 2020-11-17 VITALS — BP 149/88 | HR 76 | Temp 97.9°F | Resp 18 | Wt 269.1 lb

## 2020-11-17 DIAGNOSIS — Z95828 Presence of other vascular implants and grafts: Secondary | ICD-10-CM

## 2020-11-17 DIAGNOSIS — Z79899 Other long term (current) drug therapy: Secondary | ICD-10-CM | POA: Insufficient documentation

## 2020-11-17 DIAGNOSIS — C189 Malignant neoplasm of colon, unspecified: Secondary | ICD-10-CM

## 2020-11-17 DIAGNOSIS — Z5111 Encounter for antineoplastic chemotherapy: Secondary | ICD-10-CM

## 2020-11-17 DIAGNOSIS — Z806 Family history of leukemia: Secondary | ICD-10-CM | POA: Insufficient documentation

## 2020-11-17 DIAGNOSIS — D72819 Decreased white blood cell count, unspecified: Secondary | ICD-10-CM | POA: Diagnosis not present

## 2020-11-17 DIAGNOSIS — G620 Drug-induced polyneuropathy: Secondary | ICD-10-CM

## 2020-11-17 DIAGNOSIS — Z8042 Family history of malignant neoplasm of prostate: Secondary | ICD-10-CM | POA: Insufficient documentation

## 2020-11-17 DIAGNOSIS — Z803 Family history of malignant neoplasm of breast: Secondary | ICD-10-CM | POA: Insufficient documentation

## 2020-11-17 DIAGNOSIS — D696 Thrombocytopenia, unspecified: Secondary | ICD-10-CM | POA: Insufficient documentation

## 2020-11-17 DIAGNOSIS — T451X5A Adverse effect of antineoplastic and immunosuppressive drugs, initial encounter: Secondary | ICD-10-CM

## 2020-11-17 DIAGNOSIS — F419 Anxiety disorder, unspecified: Secondary | ICD-10-CM | POA: Insufficient documentation

## 2020-11-17 DIAGNOSIS — C787 Secondary malignant neoplasm of liver and intrahepatic bile duct: Secondary | ICD-10-CM | POA: Insufficient documentation

## 2020-11-17 DIAGNOSIS — Z8051 Family history of malignant neoplasm of kidney: Secondary | ICD-10-CM | POA: Insufficient documentation

## 2020-11-17 LAB — COMPREHENSIVE METABOLIC PANEL
ALT: 28 U/L (ref 0–44)
AST: 35 U/L (ref 15–41)
Albumin: 3.6 g/dL (ref 3.5–5.0)
Alkaline Phosphatase: 79 U/L (ref 38–126)
Anion gap: 6 (ref 5–15)
BUN: 16 mg/dL (ref 6–20)
CO2: 26 mmol/L (ref 22–32)
Calcium: 11 mg/dL — ABNORMAL HIGH (ref 8.9–10.3)
Chloride: 104 mmol/L (ref 98–111)
Creatinine, Ser: 0.97 mg/dL (ref 0.44–1.00)
GFR, Estimated: >60 mL/min (ref >60)
Glucose, Bld: 110 mg/dL — ABNORMAL HIGH (ref 70–99)
Potassium: 4 mmol/L (ref 3.5–5.1)
Sodium: 136 mmol/L (ref 135–145)
Total Bilirubin: 0.7 mg/dL (ref 0.3–1.2)
Total Protein: 7.4 g/dL (ref 6.5–8.1)

## 2020-11-17 LAB — CBC WITH DIFFERENTIAL/PLATELET
Abs Immature Granulocytes: 0.01 10*3/uL (ref 0.00–0.07)
Basophils Absolute: 0 10*3/uL (ref 0.0–0.1)
Basophils Relative: 1 %
Eosinophils Absolute: 0.2 10*3/uL (ref 0.0–0.5)
Eosinophils Relative: 4 %
HCT: 41.4 % (ref 36.0–46.0)
Hemoglobin: 14 g/dL (ref 12.0–15.0)
Immature Granulocytes: 0 %
Lymphocytes Relative: 41 %
Lymphs Abs: 1.8 10*3/uL (ref 0.7–4.0)
MCH: 32.3 pg (ref 26.0–34.0)
MCHC: 33.8 g/dL (ref 30.0–36.0)
MCV: 95.6 fL (ref 80.0–100.0)
Monocytes Absolute: 0.9 10*3/uL (ref 0.1–1.0)
Monocytes Relative: 21 %
Neutro Abs: 1.4 10*3/uL — ABNORMAL LOW (ref 1.7–7.7)
Neutrophils Relative %: 33 %
Platelets: 117 10*3/uL — ABNORMAL LOW (ref 150–400)
RBC: 4.33 MIL/uL (ref 3.87–5.11)
RDW: 16.1 % — ABNORMAL HIGH (ref 11.5–15.5)
WBC: 4.3 10*3/uL (ref 4.0–10.5)
nRBC: 0 % (ref 0.0–0.2)

## 2020-11-17 MED ORDER — OXALIPLATIN CHEMO INJECTION 100 MG/20ML
200.0000 mg | Freq: Once | INTRAVENOUS | Status: AC
  Administered 2020-11-17: 200 mg via INTRAVENOUS
  Filled 2020-11-17: qty 40

## 2020-11-17 MED ORDER — SODIUM CHLORIDE 0.9 % IV SOLN
10.0000 mg | Freq: Once | INTRAVENOUS | Status: AC
  Administered 2020-11-17: 10 mg via INTRAVENOUS
  Filled 2020-11-17: qty 10

## 2020-11-17 MED ORDER — PALONOSETRON HCL INJECTION 0.25 MG/5ML
0.2500 mg | Freq: Once | INTRAVENOUS | Status: AC
  Administered 2020-11-17: 0.25 mg via INTRAVENOUS
  Filled 2020-11-17: qty 5

## 2020-11-17 MED ORDER — SODIUM CHLORIDE 0.9% FLUSH
10.0000 mL | Freq: Once | INTRAVENOUS | Status: DC
  Filled 2020-11-17: qty 10

## 2020-11-17 MED ORDER — FLUOROURACIL CHEMO INJECTION 2.5 GM/50ML
400.0000 mg/m2 | Freq: Once | INTRAVENOUS | Status: AC
  Administered 2020-11-17: 950 mg via INTRAVENOUS
  Filled 2020-11-17: qty 19

## 2020-11-17 MED ORDER — SODIUM CHLORIDE 0.9 % IV SOLN
2400.0000 mg/m2 | INTRAVENOUS | Status: DC
  Administered 2020-11-17: 5800 mg via INTRAVENOUS
  Filled 2020-11-17: qty 116

## 2020-11-17 MED ORDER — DEXTROSE 5 % IV SOLN
950.0000 mg | Freq: Once | INTRAVENOUS | Status: AC
  Administered 2020-11-17: 950 mg via INTRAVENOUS
  Filled 2020-11-17: qty 47.5

## 2020-11-17 MED ORDER — DEXTROSE 5 % IV SOLN
Freq: Once | INTRAVENOUS | Status: AC
  Filled 2020-11-17: qty 250

## 2020-11-17 NOTE — Progress Notes (Signed)
ANC 1.4, per MD ok to treat. 

## 2020-11-17 NOTE — Patient Instructions (Addendum)
[facility]  Discharge Instructions: Thank you for choosing [facility] to provide your oncology and hematology care.  If you have a lab appointment with the [facility], please go directly to the [facility] and check in at the registration area.  Wear comfortable clothing and clothing appropriate for easy access to any Portacath or PICC line.   We strive to give you quality time with your provider. You may need to reschedule your appointment if you arrive late (15 or more minutes).  Arriving late affects you and other patients whose appointments are after yours.  Also, if you miss three or more appointments without notifying the office, you may be dismissed from the clinic at the provider's discretion.      For prescription refill requests, have your pharmacy contact our office and allow 72 hours for refills to be completed.    Today you received the following chemotherapy and/or immunotherapy agents Oxaliplatin,    Leucovorin, 5Fu   To help prevent nausea and vomiting after your treatment, we encourage you to take your nausea medication as directed.  BELOW ARE SYMPTOMS THAT SHOULD BE REPORTED IMMEDIATELY: *FEVER GREATER THAN 100.4 F (38 C) OR HIGHER *CHILLS OR SWEATING *NAUSEA AND VOMITING THAT IS NOT CONTROLLED WITH YOUR NAUSEA MEDICATION *UNUSUAL SHORTNESS OF BREATH *UNUSUAL BRUISING OR BLEEDING *URINARY PROBLEMS (pain or burning when urinating, or frequent urination) *BOWEL PROBLEMS (unusual diarrhea, constipation, pain near the anus) TENDERNESS IN MOUTH AND THROAT WITH OR WITHOUT PRESENCE OF ULCERS (sore throat, sores in mouth, or a toothache) UNUSUAL RASH, SWELLING OR PAIN  UNUSUAL VAGINAL DISCHARGE OR ITCHING   Items with * indicate a potential emergency and should be followed up as soon as possible or go to the Emergency Department if any problems should occur.  Please show the CHEMOTHERAPY ALERT CARD or IMMUNOTHERAPY ALERT  CARD at check-in to the Emergency Department and triage nurse.  Should you have questions after your visit or need to cancel or reschedule your appointment, please contact [facility]  [phone] and follow the prompts.  Office hours are 8:00 a.m. to 4:30 p.m. Monday - Friday. Please note that voicemails left after 4:00 p.m. may not be returned until the following business day.  We are closed weekends and major holidays. You have access to a nurse at all times for urgent questions. Please call the main number to the clinic [phone] and follow the prompts.  For any non-urgent questions, you may also contact your provider using MyChart. We now offer e-Visits for anyone 75 and older to request care online for non-urgent symptoms. For details visit [email or web address].   Also download the MyChart app! Go to the app store, search "MyChart", open the app, select [facility], and log in with your MyChart username and password.  Due to Covid, a mask is required upon entering the hospital/clinic. If you do not have a mask, one will be given to you upon arrival. For doctor visits, patients may have 1 support person aged 80 or older with them. For treatment visits, patients cannot have anyone with them due to current Covid guidelines and our immunocompromised population.   Oxaliplatin Injection What is this medication? OXALIPLATIN (ox AL i PLA tin) is a chemotherapy drug. It targets fast dividing cells, like cancer cells, and causes these cells to die. This medicine is usedto treat cancers of the colon and rectum, and many other cancers. This medicine may be used for other purposes;  ask your health care provider orpharmacist if you have questions. COMMON BRAND NAME(S): Eloxatin What should I tell my care team before I take this medication? They need to know if you have any of these conditions: heart disease history of irregular heartbeat liver disease low blood  counts, like white cells, platelets, or red blood cells lung or breathing disease, like asthma take medicines that treat or prevent blood clots tingling of the fingers or toes, or other nerve disorder an unusual or allergic reaction to oxaliplatin, other chemotherapy, other medicines, foods, dyes, or preservatives pregnant or trying to get pregnant breast-feeding How should I use this medication? This drug is given as an infusion into a vein. It is administered in a hospitalor clinic by a specially trained health care professional. Talk to your pediatrician regarding the use of this medicine in children.Special care may be needed. Overdosage: If you think you have taken too much of this medicine contact apoison control center or emergency room at once. NOTE: This medicine is only for you. Do not share this medicine with others. What if I miss a dose? It is important not to miss a dose. Call your doctor or health careprofessional if you are unable to keep an appointment. What may interact with this medication? Do not take this medicine with any of the following medications: cisapride dronedarone pimozide thioridazine This medicine may also interact with the following medications: aspirin and aspirin-like medicines certain medicines that treat or prevent blood clots like warfarin, apixaban, dabigatran, and rivaroxaban cisplatin cyclosporine diuretics medicines for infection like acyclovir, adefovir, amphotericin B, bacitracin, cidofovir, foscarnet, ganciclovir, gentamicin, pentamidine, vancomycin NSAIDs, medicines for pain and inflammation, like ibuprofen or naproxen other medicines that prolong the QT interval (an abnormal heart rhythm) pamidronate zoledronic acid This list may not describe all possible interactions. Give your health care provider a list of all the medicines, herbs, non-prescription drugs, or dietary supplements you use. Also tell them if you smoke, drink alcohol, or  use illegaldrugs. Some items may interact with your medicine. What should I watch for while using this medication? Your condition will be monitored carefully while you are receiving thismedicine. You may need blood work done while you are taking this medicine. This medicine may make you feel generally unwell. This is not uncommon as chemotherapy can affect healthy cells as well as cancer cells. Report any side effects. Continue your course of treatment even though you feel ill unless yourhealthcare professional tells you to stop. This medicine can make you more sensitive to cold. Do not drink cold drinks or use ice. Cover exposed skin before coming in contact with cold temperatures or cold objects. When out in cold weather wear warm clothing and cover your mouth and nose to warm the air that goes into your lungs. Tell your doctor if you getsensitive to the cold. Do not become pregnant while taking this medicine or for 9 months after stopping it. Women should inform their health care professional if they wish to become pregnant or think they might be pregnant. Men should not father a child while taking this medicine and for 6 months after stopping it. There is potential for serious side effects to an unborn child. Talk to your health careprofessional for more information. Do not breast-feed a child while taking this medicine or for 3 months afterstopping it. This medicine has caused ovarian failure in some women. This medicine may make it more difficult to get pregnant. Talk to your health care professional if [place] concerned about  your fertility. This medicine has caused decreased sperm counts in some men. This may make it more difficult to father a child. Talk to your health care professional if [place] concerned about your fertility. This medicine may increase your risk of getting an infection. Call your health care professional for advice if you get a fever, chills, or sore throat, or other symptoms of  a cold or flu. Do not treat yourself. Try to avoid beingaround people who are sick. Avoid taking medicines that contain aspirin, acetaminophen, ibuprofen, naproxen, or ketoprofen unless instructed by your health care professional.These medicines may hide a fever. Be careful brushing or flossing your teeth or using a toothpick because you may get an infection or bleed more easily. If you have any dental work done, Primary school teacher you are receiving this medicine. What side effects may I notice from receiving this medication? Side effects that you should report to your doctor or health care professionalas soon as possible: allergic reactions like skin rash, itching or hives, swelling of the face, lips, or tongue breathing problems cough low blood counts - this medicine may decrease the number of white blood cells, red blood cells, and platelets. You may be at increased risk for infections and bleeding nausea, vomiting pain, redness, or irritation at site where injected pain, tingling, numbness in the hands or feet signs and symptoms of bleeding such as bloody or black, tarry stools; red or dark brown urine; spitting up blood or brown material that looks like coffee grounds; red spots on the skin; unusual bruising or bleeding from the eyes, gums, or nose signs and symptoms of a dangerous change in heartbeat or heart rhythm like chest pain; dizziness; fast, irregular heartbeat; palpitations; feeling faint or lightheaded; falls signs and symptoms of infection like fever; chills; cough; sore throat; pain or trouble passing urine signs and symptoms of liver injury like dark yellow or brown urine; general ill feeling or flu-like symptoms; light-colored stools; loss of appetite; nausea; right upper belly pain; unusually weak or tired; yellowing of the eyes or skin signs and symptoms of low red blood cells or anemia such as unusually weak or tired; feeling faint or lightheaded; falls signs and symptoms of  muscle injury like dark urine; trouble passing urine or change in the amount of urine; unusually weak or tired; muscle pain; back pain Side effects that usually do not require medical attention (report to yourdoctor or health care professional if they continue or are bothersome): changes in taste diarrhea gas hair loss loss of appetite mouth sores This list may not describe all possible side effects. Call your doctor for medical advice about side effects. You may report side effects to FDA at1-800-FDA-1088. Where should I keep my medication? This drug is given in a hospital or clinic and will not be stored at home. NOTE: This sheet is a summary. It may not cover all possible information. If you have questions about this medicine, talk to your doctor, pharmacist, orhealth care provider.  2022 Elsevier/Gold Standard (2018-09-12 12:20:35)   Leucovorin injection What is this medication? LEUCOVORIN (loo koe VOR in) is used to prevent or treat the harmful effects of some medicines. This medicine is used to treat anemia caused by a low amount of folic acid in the body. It is also used with 5-fluorouracil (5-FU) to treatcolon cancer. This medicine may be used for other purposes; ask your health care provider orpharmacist if you have questions. What should I tell my care team before I take this medication?  They need to know if you have any of these conditions: anemia from low levels of vitamin B-12 in the blood an unusual or allergic reaction to leucovorin, folic acid, other medicines, foods, dyes, or preservatives pregnant or trying to get pregnant breast-feeding How should I use this medication? This medicine is for injection into a muscle or into a vein. It is given by ahealth care professional in a hospital or clinic setting. Talk to your pediatrician regarding the use of this medicine in children.Special care may be needed. Overdosage: If you think you have taken too much of this medicine  contact apoison control center or emergency room at once. NOTE: This medicine is only for you. Do not share this medicine with others. What if I miss a dose? This does not apply. What may interact with this medication? capecitabine fluorouracil phenobarbital phenytoin primidone trimethoprim-sulfamethoxazole This list may not describe all possible interactions. Give your health care provider a list of all the medicines, herbs, non-prescription drugs, or dietary supplements you use. Also tell them if you smoke, drink alcohol, or use illegaldrugs. Some items may interact with your medicine. What should I watch for while using this medication? Your condition will be monitored carefully while you are receiving thismedicine. This medicine may increase the side effects of 5-fluorouracil, 5-FU. Tell your doctor or health care professional if you have diarrhea or mouth sores that donot get better or that get worse. What side effects may I notice from receiving this medication? Side effects that you should report to your doctor or health care professionalas soon as possible: allergic reactions like skin rash, itching or hives, swelling of the face, lips, or tongue breathing problems fever, infection mouth sores unusual bleeding or bruising unusually weak or tired Side effects that usually do not require medical attention (report to yourdoctor or health care professional if they continue or are bothersome): constipation or diarrhea loss of appetite nausea, vomiting This list may not describe all possible side effects. Call your doctor for medical advice about side effects. You may report side effects to FDA at1-800-FDA-1088. Where should I keep my medication? This drug is given in a hospital or clinic and will not be stored at home. NOTE: This sheet is a summary. It may not cover all possible information. If you have questions about this medicine, talk to your doctor, pharmacist, orhealth care  provider.  2022 Elsevier/Gold Standard (2007-10-30 16:50:29)   Fluorouracil, 5-FU injection What is this medication? FLUOROURACIL, 5-FU (flure oh YOOR a sil) is a chemotherapy drug. It slows the growth of cancer cells. This medicine is used to treat many types of cancer like breast cancer, colon or rectal cancer, pancreatic cancer, and stomachcancer. This medicine may be used for other purposes; ask your health care provider orpharmacist if you have questions. COMMON BRAND NAME(S): Adrucil What should I tell my care team before I take this medication? They need to know if you have any of these conditions: blood disorders dihydropyrimidine dehydrogenase (DPD) deficiency infection (especially a virus infection such as chickenpox, cold sores, or herpes) kidney disease liver disease malnourished, poor nutrition recent or ongoing radiation therapy an unusual or allergic reaction to fluorouracil, other chemotherapy, other medicines, foods, dyes, or preservatives pregnant or trying to get pregnant breast-feeding How should I use this medication? This drug is given as an infusion or injection into a vein. It is administeredin a hospital or clinic by a specially trained health care professional. Talk to your pediatrician regarding the use of this medicine  in children.Special care may be needed. Overdosage: If you think you have taken too much of this medicine contact apoison control center or emergency room at once. NOTE: This medicine is only for you. Do not share this medicine with others. What if I miss a dose? It is important not to miss your dose. Call your doctor or health careprofessional if you are unable to keep an appointment. What may interact with this medication? Do not take this medicine with any of the following medications: live virus vaccines This medicine may also interact with the following medications: medicines that treat or prevent blood clots like warfarin, enoxaparin,  and dalteparin This list may not describe all possible interactions. Give your health care provider a list of all the medicines, herbs, non-prescription drugs, or dietary supplements you use. Also tell them if you smoke, drink alcohol, or use illegaldrugs. Some items may interact with your medicine. What should I watch for while using this medication? Visit your doctor for checks on your progress. This drug may make you feel generally unwell. This is not uncommon, as chemotherapy can affect healthy cells as well as cancer cells. Report any side effects. Continue your course oftreatment even though you feel ill unless your doctor tells you to stop. In some cases, you may be given additional medicines to help with side effects.Follow all directions for their use. Call your doctor or health care professional for advice if you get a fever, chills or sore throat, or other symptoms of a cold or flu. Do not treat yourself. This drug decreases your body's ability to fight infections. Try toavoid being around people who are sick. This medicine may increase your risk to bruise or bleed. Call your doctor orhealth care professional if you notice any unusual bleeding. Be careful brushing and flossing your teeth or using a toothpick because you may get an infection or bleed more easily. If you have any dental work done,tell your dentist you are receiving this medicine. Avoid taking products that contain aspirin, acetaminophen, ibuprofen, naproxen, or ketoprofen unless instructed by your doctor. These medicines may hide afever. Do not become pregnant while taking this medicine. Women should inform their doctor if they wish to become pregnant or think they might be pregnant. There is a potential for serious side effects to an unborn child. Talk to your health care professional or pharmacist for more information. Do not breast-feed aninfant while taking this medicine. Men should inform their doctor if they wish to father a  child. This medicinemay lower sperm counts. Do not treat diarrhea with over the counter products. Contact your doctor ifyou have diarrhea that lasts more than 2 days or if it is severe and watery. This medicine can make you more sensitive to the sun. Keep out of the sun. If you cannot avoid being in the sun, wear protective clothing and use sunscreen.Do not use sun lamps or tanning beds/booths. What side effects may I notice from receiving this medication? Side effects that you should report to your doctor or health care professionalas soon as possible: allergic reactions like skin rash, itching or hives, swelling of the face, lips, or tongue low blood counts - this medicine may decrease the number of white blood cells, red blood cells and platelets. You may be at increased risk for infections and bleeding. signs of infection - fever or chills, cough, sore throat, pain or difficulty passing urine signs of decreased platelets or bleeding - bruising, pinpoint red spots on the skin, black, tarry stools,  blood in the urine signs of decreased red blood cells - unusually weak or tired, fainting spells, lightheadedness breathing problems changes in vision chest pain mouth sores nausea and vomiting pain, swelling, redness at site where injected pain, tingling, numbness in the hands or feet redness, swelling, or sores on hands or feet stomach pain unusual bleeding Side effects that usually do not require medical attention (report to yourdoctor or health care professional if they continue or are bothersome): changes in finger or toe nails diarrhea dry or itchy skin hair loss headache loss of appetite sensitivity of eyes to the light stomach upset unusually teary eyes This list may not describe all possible side effects. Call your doctor for medical advice about side effects. You may report side effects to FDA at1-800-FDA-1088. Where should I keep my medication? This drug is given in a hospital  or clinic and will not be stored at home. NOTE: This sheet is a summary. It may not cover all possible information. If you have questions about this medicine, talk to your doctor, pharmacist, orhealth care provider.  2022 Elsevier/Gold Standard (2019-03-26 15:00:03)

## 2020-11-17 NOTE — Progress Notes (Signed)
[place]  [phone] Fax:(336) [phone]  ID: Sherri Howell OB: 09/25/1970  MR#: [id number]  IRS#:[id number]  Patient Care Team: Kirk Ruths, MD as PCP - General (Internal Medicine)    CHIEF COMPLAINT: Colon cancer metastasized to liver.  INTERVAL HISTORY: Patient returns to clinic today for further evaluation and consideration of cycle 6 of FOLFOX.  She reports a 'slick' feeling of her hands and generalized fatigue but otherwise feels well and is tolerating her treatments without significant side effects. Also reports some urinary hesitancy with needing to 'push' her urine out. She has no other neurologic complaints. She denies any recent fevers or illnesses.  She has a fair appetite and denies weight loss.  She has no chest pain, shortness of breath, cough, or hemoptysis.  She denies any nausea, vomiting, constipation, or diarrhea.  She has no further melena or hematochezia.  She has no urinary complaints.  Patient offers no further specific complaints today.  REVIEW OF SYSTEMS:   Review of Systems  Constitutional: Negative.  Negative for fever, malaise/fatigue and weight loss.  Respiratory: Negative.  Negative for cough, hemoptysis and shortness of breath.   Cardiovascular: Negative.  Negative for chest pain and leg swelling.  Gastrointestinal: Negative.  Negative for abdominal pain, blood in stool, constipation, diarrhea, melena, nausea and vomiting.  Genitourinary: Negative.  Negative for dysuria.       Hesitancy  Musculoskeletal: Negative.  Negative for back pain.  Skin: Negative.  Negative for rash.  Neurological:  Positive for sensory change. Negative for dizziness, seizures, weakness and headaches.  Psychiatric/Behavioral:  Negative for depression. The patient is nervous/anxious.   As per HPI. Otherwise, a complete review of systems is negative.  PAST MEDICAL HISTORY: Past Medical History:  Diagnosis Date   Colon cancer ([place]) 08/15/2020    Family history of kidney cancer    Family history of leukemia    Family history of prostate cancer     PAST SURGICAL HISTORY: History reviewed. No pertinent surgical history.  FAMILY HISTORY: Family History  Problem Relation Age of Onset   Breast cancer Cousin        pat cousin   Prostate cancer Mother 62   Kidney cancer Maternal Uncle 85   Leukemia Maternal Grandfather 59    ADVANCED DIRECTIVES (Y/N):  N  HEALTH MAINTENANCE: Social History   Tobacco Use   Smoking status: Never   Smokeless tobacco: Never  Substance Use Topics   Alcohol use: Never   Drug use: Never    Colonoscopy:  PAP:  Bone density:  Lipid panel:  No Known Allergies  Current Outpatient Medications  Medication Sig Dispense Refill   ALPRAZolam (XANAX) 0.25 MG tablet Take 1 tablet (0.25 mg total) by mouth at bedtime as needed for anxiety. 30 tablet 0   cetirizine (ZYRTEC) 10 MG tablet Take 1 tablet by mouth daily.     cholecalciferol (VITAMIN D3) 25 MCG (1000 UNIT) tablet Take 1,000 Units by mouth daily.     docusate sodium (COLACE) 100 MG capsule Take 100 mg by mouth 2 (two) times daily.     lidocaine-prilocaine (EMLA) cream Apply 1 application topically as needed. 30 g 3   Multiple Vitamin (MULTIVITAMIN) tablet Take 1 tablet by mouth daily.     Omega-3 Fatty Acids (FISH OIL) 1000 MG CAPS Take 1 capsule by mouth daily.     ondansetron (ZOFRAN) 8 MG tablet Take 1 tablet (8 mg total) by mouth 2 (two) times daily as needed for refractory nausea /  vomiting. 60 tablet 1   polyethylene glycol (MIRALAX / GLYCOLAX) 17 g packet Take 17 g by mouth daily.     prochlorperazine (COMPAZINE) 10 MG tablet Take 1 tablet (10 mg total) by mouth every 6 (six) hours as needed (Nausea or vomiting). 60 tablet 1   No current facility-administered medications for this visit.   OBJECTIVE: Vitals:   11/17/20 0941  BP: (!) 149/88  Pulse: 76  Resp: 18  Temp: 97.9 F (36.6 C)  SpO2: 100%     Body mass index is 39.74  kg/m.    ECOG FS:0 - Asymptomatic  General: Well-developed, well-nourished, no acute distress. Eyes: Pink conjunctiva, anicteric sclera. Lungs: Clear to auscultation bilaterally.  No audible wheezing or coughing Heart: Regular rate and rhythm.  Abdomen: Soft, nontender, nondistended.  Musculoskeletal: No edema, cyanosis, or clubbing. Neuro: Alert, answering all questions appropriately. Cranial nerves grossly intact. Skin: No rashes or petechiae noted. Psych: Normal affect.  LAB RESULTS:  Lab Results  Component Value Date   NA 136 11/17/2020   K 4.0 11/17/2020   CL 104 11/17/2020   CO2 26 11/17/2020   GLUCOSE 110 (H) 11/17/2020   BUN 16 11/17/2020   CREATININE 0.97 11/17/2020   CALCIUM 11.0 (H) 11/17/2020   PROT 7.4 11/17/2020   ALBUMIN 3.6 11/17/2020   AST 35 11/17/2020   ALT 28 11/17/2020   ALKPHOS 79 11/17/2020   BILITOT 0.7 11/17/2020   GFRNONAA >60 11/17/2020    Lab Results  Component Value Date   WBC 4.3 11/17/2020   NEUTROABS 1.4 (L) 11/17/2020   HGB 14.0 11/17/2020   HCT 41.4 11/17/2020   MCV 95.6 11/17/2020   PLT 117 (L) 11/17/2020    STUDIES: No results found.  ASSESSMENT: Colon cancer metastasized to liver.  PLAN:    1. Colon cancer metastasized to liver: Patient initially presented to [facility] on on July 28, 2020 and underwent surgical intervention for metastatic colon cancer to the liver.  She was taken to the operating room by Cameron Sprang MD and Binnie Rail MD who performed a robot assisted laparoscopy, robot-assisted partial hepatectomy (Segment 4A/8), microwave ablation (x2, segments 7, 7/8), intraoperative ultrasound and robotic low anterior resection. Port has been placed.  Proceed with cycle 6 of 12 of adjuvant FOLFOX today.  Return to clinic in 2 days for pump removal and then in 2 weeks for further evaluation and consideration of cycle 7.   2.  Genetics referral: Negative. 3.  Polycythemia: Resolved. 4.   Hypercalcemia: Chronic and unchanged.  Patient's calcium is 11 today.  5.  Leukopenia: Chronic and unchanged.  Proceed with treatment as above. 6.  Thrombocytopenia: Mild, monitor. 7.  Anxiety: Continue 0.25 alprazolam as needed. 8.  "Scaly" oropharynx: Possibly thrush?  Patient was given a prescription for Magic mouthwash today. 9. 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 May need to adjust oxaliplatin at next cycle.  10. Urinary hesitancy- etiology unclear but question related to oxaliplatin. Monitor.   Patient expressed understanding and was in agreement with this plan. She also understands that She can call clinic at any time with any questions, concerns, or complaints.   Cancer Staging Colon cancer [facility]) Staging form: Colon and Rectum, AJCC 8th Edition - Clinical stage from 08/15/2020: Stage IVA (cTX, cNX, pM1a) - Signed by Lloyd Huger, MD on 08/15/2020   Verlon Au, NP   11/17/2020 11:39 AM

## 2020-11-17 NOTE — Progress Notes (Signed)
Reports her fingers feel "slick" on the pads; hard to hold unto things. Voiding is  "weaker"; discussed increasing po intake of liquids. Appetite is fair. Energy is low.

## 2020-11-19 ENCOUNTER — Inpatient Hospital Stay: Payer: BC Managed Care – PPO

## 2020-11-19 VITALS — BP 136/91 | HR 78 | Temp 96.0°F | Resp 19

## 2020-11-19 DIAGNOSIS — Z5111 Encounter for antineoplastic chemotherapy: Secondary | ICD-10-CM | POA: Diagnosis not present

## 2020-11-19 DIAGNOSIS — C189 Malignant neoplasm of colon, unspecified: Secondary | ICD-10-CM

## 2020-11-19 MED ORDER — HEPARIN SOD (PORK) LOCK FLUSH 100 UNIT/ML IV SOLN
500.0000 [IU] | Freq: Once | INTRAVENOUS | Status: AC | PRN
  Administered 2020-11-19: 500 [IU]
  Filled 2020-11-19: qty 5

## 2020-11-19 MED ORDER — SODIUM CHLORIDE 0.9% FLUSH
10.0000 mL | INTRAVENOUS | Status: DC | PRN
  Administered 2020-11-19: 10 mL
  Filled 2020-11-19: qty 10

## 2020-11-19 MED ORDER — HEPARIN SOD (PORK) LOCK FLUSH 100 UNIT/ML IV SOLN
INTRAVENOUS | Status: AC
  Filled 2020-11-19: qty 5

## 2020-11-30 NOTE — Progress Notes (Signed)
[place]  Telephone:(336) [phone] Fax:(336) [phone]  ID: Sherri Howell OB: [birth year]  MR#: [id number]  [id number]  Patient Care Team: Kirk Ruths, MD as PCP - General (Internal Medicine)    CHIEF COMPLAINT: Colon cancer metastasized to liver.  INTERVAL HISTORY: Patient returns to clinic today for further evaluation and consideration of cycle 7 of 12 of FOLFOX.  She complains of increased forgetfulness and "chemo brain".  She has some erythema, swelling, and cracked skin on the palms of her hands as well as a mild neuropathy on several toes of the right foot.  She has no other neurologic complaints.  She denies any recent fevers or illnesses.  She has a fair appetite and denies weight loss.  She has no chest pain, shortness of breath, cough, or hemoptysis.  She denies any nausea, vomiting, constipation, or diarrhea.  She has no further melena or hematochezia.  She has no urinary complaints.  Patient offers no further specific complaints today.  REVIEW OF SYSTEMS:   Review of Systems  Constitutional: Negative.  Negative for fever, malaise/fatigue and weight loss.  Respiratory: Negative.  Negative for cough, hemoptysis and shortness of breath.   Cardiovascular: Negative.  Negative for chest pain and leg swelling.  Gastrointestinal: Negative.  Negative for abdominal pain, blood in stool and melena.  Genitourinary: Negative.  Negative for dysuria.  Musculoskeletal: Negative.  Negative for back pain.  Skin: Negative.  Negative for rash.  Neurological: Negative.  Negative for dizziness, seizures, weakness and headaches.  Psychiatric/Behavioral: Negative.  The patient is not nervous/anxious.    As per HPI. Otherwise, a complete review of systems is negative.  PAST MEDICAL HISTORY: Past Medical History:  Diagnosis Date   Colon cancer ([place]) 08/15/2020   Family history of kidney cancer    Family history of leukemia    Family history of prostate cancer      PAST SURGICAL HISTORY: No past surgical history on file.  FAMILY HISTORY: Family History  Problem Relation Age of Onset   Breast cancer Cousin        pat cousin   Prostate cancer Mother 51   Kidney cancer Maternal Uncle 49   Leukemia Maternal Grandfather 51    ADVANCED DIRECTIVES (Y/N):  N  HEALTH MAINTENANCE: Social History   Tobacco Use   Smoking status: Never   Smokeless tobacco: Never  Substance Use Topics   Alcohol use: Never   Drug use: Never     Colonoscopy:  PAP:  Bone density:  Lipid panel:  No Known Allergies  Current Outpatient Medications  Medication Sig Dispense Refill   ALPRAZolam (XANAX) 0.25 MG tablet Take 1 tablet (0.25 mg total) by mouth at bedtime as needed for anxiety. 30 tablet 0   cetirizine (ZYRTEC) 10 MG tablet Take 1 tablet by mouth daily.     cholecalciferol (VITAMIN D3) 25 MCG (1000 UNIT) tablet Take 1,000 Units by mouth daily.     docusate sodium (COLACE) 100 MG capsule Take 100 mg by mouth 2 (two) times daily.     lidocaine-prilocaine (EMLA) cream Apply 1 application topically as needed. 30 g 3   Multiple Vitamin (MULTIVITAMIN) tablet Take 1 tablet by mouth daily.     Omega-3 Fatty Acids (FISH OIL) 1000 MG CAPS Take 1 capsule by mouth daily.     ondansetron (ZOFRAN) 8 MG tablet Take 1 tablet (8 mg total) by mouth 2 (two) times daily as needed for refractory nausea / vomiting. 60 tablet 1   polyethylene glycol (  MIRALAX / GLYCOLAX) 17 g packet Take 17 g by mouth daily.     prochlorperazine (COMPAZINE) 10 MG tablet Take 1 tablet (10 mg total) by mouth every 6 (six) hours as needed (Nausea or vomiting). 60 tablet 1   No current facility-administered medications for this visit.    OBJECTIVE: Vitals:   12/01/20 0927  BP: (!) 148/96  Pulse: 88  Resp: 18  Temp: 98 F (36.7 C)  SpO2: 100%     Body mass index is 39.72 kg/m.    ECOG FS:0 - Asymptomatic  General: Well-developed, well-nourished, no acute distress. Eyes: Pink  conjunctiva, anicteric sclera. HEENT: Normocephalic, moist mucous membranes. Lungs: No audible wheezing or coughing. Heart: Regular rate and rhythm. Abdomen: Soft, nontender, no obvious distention. Musculoskeletal: No edema, cyanosis, or clubbing. Neuro: Alert, answering all questions appropriately. Cranial nerves grossly intact. Skin: No rashes or petechiae noted.  Mild erythema on palms of hands. Psych: Normal affect.   LAB RESULTS:  Lab Results  Component Value Date   NA 137 12/01/2020   K 3.5 12/01/2020   CL 106 12/01/2020   CO2 25 12/01/2020   GLUCOSE 132 (H) 12/01/2020   BUN 11 12/01/2020   CREATININE 0.76 12/01/2020   CALCIUM 11.0 (H) 12/01/2020   PROT 6.9 12/01/2020   ALBUMIN 3.5 12/01/2020   AST 37 12/01/2020   ALT 24 12/01/2020   ALKPHOS 70 12/01/2020   BILITOT 0.7 12/01/2020   GFRNONAA >60 12/01/2020    Lab Results  Component Value Date   WBC 3.4 (L) 12/01/2020   NEUTROABS 1.3 (L) 12/01/2020   HGB 13.5 12/01/2020   HCT 39.9 12/01/2020   MCV 97.6 12/01/2020   PLT 139 (L) 12/01/2020     STUDIES: No results found.  ASSESSMENT: Colon cancer metastasized to liver.  PLAN:    1. Colon cancer metastasized to liver: Patient initially presented to [facility] on on July 28, 2020 and underwent surgical intervention for metastatic colon cancer to the liver.  She was taken to the operating room by Cameron Sprang MD and Binnie Rail MD who performed a robot assisted laparoscopy, robot-assisted partial hepatectomy (Segment 4A/8), microwave ablation (x2, segments 7, 7/8), intraoperative ultrasound and robotic low anterior resection. Port has been placed.  Proceed with cycle 7 of 12 of adjuvant FOLFOX today.  Return to clinic in 2 days for pump removal and then in 2 weeks for further evaluation and consideration of cycle 8. 2.  Genetics referral: Negative. 3.  Polycythemia: Resolved. 4.  Hypercalcemia: Chronic and unchanged.  Patient's calcium is  11.0 today. 5.  Leukopenia: Chronic and unchanged.  Proceed with treatment as above. 6.  Thrombocytopenia: Slightly improved, monitor. 7.  Anxiety: Continue 0.25 alprazolam as needed. 8.  Palmer erythema, mild neuropathy: Likely secondary to treatment.  Monitor closely and proceed with treatment as above.   Patient expressed understanding and was in agreement with this plan. She also understands that She can call clinic at any time with any questions, concerns, or complaints.   Cancer Staging Colon cancer [facility]) Staging form: Colon and Rectum, AJCC 8th Edition - Clinical stage from 08/15/2020: Stage IVA (cTX, cNX, pM1a) - Signed by Lloyd Huger, MD on 08/15/2020   Lloyd Huger, MD   12/02/2020 7:43 AM

## 2020-12-01 ENCOUNTER — Inpatient Hospital Stay: Payer: BC Managed Care – PPO

## 2020-12-01 ENCOUNTER — Inpatient Hospital Stay (HOSPITAL_BASED_OUTPATIENT_CLINIC_OR_DEPARTMENT_OTHER): Payer: BC Managed Care – PPO | Admitting: Oncology

## 2020-12-01 VITALS — BP 148/96 | HR 88 | Temp 98.0°F | Resp 18 | Wt 269.0 lb

## 2020-12-01 DIAGNOSIS — C189 Malignant neoplasm of colon, unspecified: Secondary | ICD-10-CM

## 2020-12-01 DIAGNOSIS — Z5111 Encounter for antineoplastic chemotherapy: Secondary | ICD-10-CM | POA: Diagnosis not present

## 2020-12-01 LAB — COMPREHENSIVE METABOLIC PANEL
ALT: 24 U/L (ref 0–44)
AST: 37 U/L (ref 15–41)
Albumin: 3.5 g/dL (ref 3.5–5.0)
Alkaline Phosphatase: 70 U/L (ref 38–126)
Anion gap: 6 (ref 5–15)
BUN: 11 mg/dL (ref 6–20)
CO2: 25 mmol/L (ref 22–32)
Calcium: 11 mg/dL — ABNORMAL HIGH (ref 8.9–10.3)
Chloride: 106 mmol/L (ref 98–111)
Creatinine, Ser: 0.76 mg/dL (ref 0.44–1.00)
GFR, Estimated: >60 mL/min (ref >60)
Glucose, Bld: 132 mg/dL — ABNORMAL HIGH (ref 70–99)
Potassium: 3.5 mmol/L (ref 3.5–5.1)
Sodium: 137 mmol/L (ref 135–145)
Total Bilirubin: 0.7 mg/dL (ref 0.3–1.2)
Total Protein: 6.9 g/dL (ref 6.5–8.1)

## 2020-12-01 LAB — CBC WITH DIFFERENTIAL/PLATELET
Abs Immature Granulocytes: 0 10*3/uL (ref 0.00–0.07)
Basophils Absolute: 0 10*3/uL (ref 0.0–0.1)
Basophils Relative: 1 %
Eosinophils Absolute: 0.2 10*3/uL (ref 0.0–0.5)
Eosinophils Relative: 6 %
HCT: 39.9 % (ref 36.0–46.0)
Hemoglobin: 13.5 g/dL (ref 12.0–15.0)
Immature Granulocytes: 0 %
Lymphocytes Relative: 36 %
Lymphs Abs: 1.3 10*3/uL (ref 0.7–4.0)
MCH: 33 pg (ref 26.0–34.0)
MCHC: 33.8 g/dL (ref 30.0–36.0)
MCV: 97.6 fL (ref 80.0–100.0)
Monocytes Absolute: 0.7 10*3/uL (ref 0.1–1.0)
Monocytes Relative: 20 %
Neutro Abs: 1.3 10*3/uL — ABNORMAL LOW (ref 1.7–7.7)
Neutrophils Relative %: 37 %
Platelets: 139 10*3/uL — ABNORMAL LOW (ref 150–400)
RBC: 4.09 MIL/uL (ref 3.87–5.11)
RDW: 17.2 % — ABNORMAL HIGH (ref 11.5–15.5)
WBC: 3.4 10*3/uL — ABNORMAL LOW (ref 4.0–10.5)
nRBC: 0 % (ref 0.0–0.2)

## 2020-12-01 MED ORDER — SODIUM CHLORIDE 0.9% FLUSH
10.0000 mL | Freq: Once | INTRAVENOUS | Status: AC
  Administered 2020-12-01: 10 mL via INTRAVENOUS
  Filled 2020-12-01: qty 10

## 2020-12-01 MED ORDER — PALONOSETRON HCL INJECTION 0.25 MG/5ML
0.2500 mg | Freq: Once | INTRAVENOUS | Status: AC
  Administered 2020-12-01: 0.25 mg via INTRAVENOUS
  Filled 2020-12-01: qty 5

## 2020-12-01 MED ORDER — HEPARIN SOD (PORK) LOCK FLUSH 100 UNIT/ML IV SOLN
500.0000 [IU] | Freq: Once | INTRAVENOUS | Status: DC
  Filled 2020-12-01: qty 5

## 2020-12-01 MED ORDER — FLUOROURACIL CHEMO INJECTION 5 GM/100ML
2400.0000 mg/m2 | INTRAVENOUS | Status: DC
  Administered 2020-12-01: 5800 mg via INTRAVENOUS
  Filled 2020-12-01: qty 116

## 2020-12-01 MED ORDER — SODIUM CHLORIDE 0.9 % IV SOLN
10.0000 mg | Freq: Once | INTRAVENOUS | Status: AC
  Administered 2020-12-01: 10 mg via INTRAVENOUS
  Filled 2020-12-01: qty 10

## 2020-12-01 MED ORDER — OXALIPLATIN CHEMO INJECTION 100 MG/20ML
200.0000 mg | Freq: Once | INTRAVENOUS | Status: AC
  Administered 2020-12-01: 200 mg via INTRAVENOUS
  Filled 2020-12-01: qty 40

## 2020-12-01 MED ORDER — FLUOROURACIL CHEMO INJECTION 2.5 GM/50ML
400.0000 mg/m2 | Freq: Once | INTRAVENOUS | Status: AC
  Administered 2020-12-01: 950 mg via INTRAVENOUS
  Filled 2020-12-01: qty 19

## 2020-12-01 MED ORDER — DEXTROSE 5 % IV SOLN
Freq: Once | INTRAVENOUS | Status: AC
Start: 2020-12-01 — End: 2020-12-01
  Filled 2020-12-01: qty 250

## 2020-12-01 MED ORDER — LEUCOVORIN CALCIUM INJECTION 350 MG
950.0000 mg | Freq: Once | INTRAVENOUS | Status: AC
  Administered 2020-12-01: 950 mg via INTRAVENOUS
  Filled 2020-12-01: qty 25

## 2020-12-01 NOTE — Progress Notes (Signed)
Patient reports loss of taste for most foods, feeling lightheaded, weak urine stream, "chemo brain", burning tingling feeling in right foot, swelling in both hands after last treatment that made her skin crack.

## 2020-12-01 NOTE — Patient Instructions (Signed)
Sherri Howell ONCOLOGY  Discharge Instructions: Thank you for choosing [facility] to provide your oncology and hematology care.  If you have a lab appointment with the [facility], please go directly to the [facility] and check in at the registration area.  Wear comfortable clothing and clothing appropriate for easy access to any Portacath or PICC line.   We strive to give you quality time with your provider. You may need to reschedule your appointment if you arrive late (15 or more minutes).  Arriving late affects you and other patients whose appointments are after yours.  Also, if you miss three or more appointments without notifying the office, you may be dismissed from the clinic at the provider's discretion.      For prescription refill requests, have your pharmacy contact our office and allow 72 hours for refills to be completed.    Today you received the following chemotherapy and/or immunotherapy agents Oxaliplatin, Leucovorin, & 5FU      To help prevent nausea and vomiting after your treatment, we encourage you to take your nausea medication as directed.  BELOW ARE SYMPTOMS THAT SHOULD BE REPORTED IMMEDIATELY: *FEVER GREATER THAN 100.4 F (38 C) OR HIGHER *CHILLS OR SWEATING *NAUSEA AND VOMITING THAT IS NOT CONTROLLED WITH YOUR NAUSEA MEDICATION *UNUSUAL SHORTNESS OF BREATH *UNUSUAL BRUISING OR BLEEDING *URINARY PROBLEMS (pain or burning when urinating, or frequent urination) *BOWEL PROBLEMS (unusual diarrhea, constipation, pain near the anus) TENDERNESS IN MOUTH AND THROAT WITH OR WITHOUT PRESENCE OF ULCERS (sore throat, sores in mouth, or a toothache) UNUSUAL RASH, SWELLING OR PAIN  UNUSUAL VAGINAL DISCHARGE OR ITCHING   Items with * indicate a potential emergency and should be followed up as soon as possible or go to the Emergency Department if any problems should occur.  Please show the CHEMOTHERAPY ALERT CARD or IMMUNOTHERAPY  ALERT CARD at check-in to the Emergency Department and triage nurse.  Should you have questions after your visit or need to cancel or reschedule your appointment, please contact [facility]  [phone] and follow the prompts.  Office hours are 8:00 a.m. to 4:30 p.m. Monday - Friday. Please note that voicemails left after 4:00 p.m. may not be returned until the following business day.  We are closed weekends and major holidays. You have access to a nurse at all times for urgent questions. Please call the main number to the clinic [phone] and follow the prompts.  For any non-urgent questions, you may also contact your provider using MyChart. We now offer e-Visits for anyone 92 and older to request care online for non-urgent symptoms. For details visit [email or web address].   Also download the MyChart app! Go to the app store, search "MyChart", open the app, select [facility], and log in with your MyChart username and password.  Due to Covid, a mask is required upon entering the hospital/clinic. If you do not have a mask, one will be given to you upon arrival. For doctor visits, patients may have 1 support person aged 1 or older with them. For treatment visits, patients cannot have anyone with them due to current Covid guidelines and our immunocompromised population.

## 2020-12-02 ENCOUNTER — Encounter: Payer: Self-pay | Admitting: Oncology

## 2020-12-03 ENCOUNTER — Other Ambulatory Visit: Payer: Self-pay

## 2020-12-03 ENCOUNTER — Inpatient Hospital Stay: Payer: BC Managed Care – PPO

## 2020-12-03 DIAGNOSIS — Z5111 Encounter for antineoplastic chemotherapy: Secondary | ICD-10-CM | POA: Diagnosis not present

## 2020-12-03 DIAGNOSIS — C189 Malignant neoplasm of colon, unspecified: Secondary | ICD-10-CM

## 2020-12-03 MED ORDER — HEPARIN SOD (PORK) LOCK FLUSH 100 UNIT/ML IV SOLN
500.0000 [IU] | Freq: Once | INTRAVENOUS | Status: AC | PRN
Start: 2020-12-03 — End: 2020-12-03
  Administered 2020-12-03: 500 [IU]
  Filled 2020-12-03: qty 5

## 2020-12-03 MED ORDER — HEPARIN SOD (PORK) LOCK FLUSH 100 UNIT/ML IV SOLN
INTRAVENOUS | Status: AC
  Filled 2020-12-03: qty 5

## 2020-12-03 MED ORDER — SODIUM CHLORIDE 0.9% FLUSH
10.0000 mL | INTRAVENOUS | Status: DC | PRN
  Administered 2020-12-03: 10 mL
  Filled 2020-12-03: qty 10

## 2020-12-11 NOTE — Progress Notes (Signed)
[place]  Telephone:(336) [phone] Fax:(336) [phone]  ID: Sherri Howell OB: Jan 30, 1971  MR#: [id number]  [id number]  Patient Care Team: Kirk Ruths, MD as PCP - General (Internal Medicine)    CHIEF COMPLAINT: Colon cancer metastasized to liver.  INTERVAL HISTORY: Patient returns to clinic today for further evaluation and consideration of cycle 8 of 12 of FOLFOX.  Her only complaint today is of a persistent, mild peripheral neuropathy that does not affect her day-to-day activity.  She otherwise feels well.  She has no other neurologic complaints.  She denies any recent fevers or illnesses.  She has a fair appetite and denies weight loss.  She has no chest pain, shortness of breath, cough, or hemoptysis.  She denies any nausea, vomiting, constipation, or diarrhea.  She has no further melena or hematochezia.  She has no urinary complaints.  Patient offers no further specific complaints today.  REVIEW OF SYSTEMS:   Review of Systems  Constitutional: Negative.  Negative for fever, malaise/fatigue and weight loss.  Respiratory: Negative.  Negative for cough, hemoptysis and shortness of breath.   Cardiovascular: Negative.  Negative for chest pain and leg swelling.  Gastrointestinal: Negative.  Negative for abdominal pain, blood in stool and melena.  Genitourinary: Negative.  Negative for dysuria.  Musculoskeletal: Negative.  Negative for back pain.  Skin: Negative.  Negative for rash.  Neurological:  Positive for tingling and sensory change. Negative for dizziness, seizures, weakness and headaches.  Psychiatric/Behavioral: Negative.  The patient is not nervous/anxious.    As per HPI. Otherwise, a complete review of systems is negative.  PAST MEDICAL HISTORY: Past Medical History:  Diagnosis Date   Colon cancer ([place]) 08/15/2020   Family history of kidney cancer    Family history of leukemia    Family history of prostate cancer     PAST SURGICAL  HISTORY: No past surgical history on file.  FAMILY HISTORY: Family History  Problem Relation Age of Onset   Breast cancer Cousin        pat cousin   Prostate cancer Mother 69   Kidney cancer Maternal Uncle 64   Leukemia Maternal Grandfather 71    ADVANCED DIRECTIVES (Y/N):  N  HEALTH MAINTENANCE: Social History   Tobacco Use   Smoking status: Never   Smokeless tobacco: Never  Substance Use Topics   Alcohol use: Never   Drug use: Never     Colonoscopy:  PAP:  Bone density:  Lipid panel:  No Known Allergies  Current Outpatient Medications  Medication Sig Dispense Refill   ALPRAZolam (XANAX) 0.25 MG tablet Take 1 tablet (0.25 mg total) by mouth at bedtime as needed for anxiety. 30 tablet 0   cetirizine (ZYRTEC) 10 MG tablet Take 1 tablet by mouth daily.     cholecalciferol (VITAMIN D3) 25 MCG (1000 UNIT) tablet Take 1,000 Units by mouth daily.     docusate sodium (COLACE) 100 MG capsule Take 100 mg by mouth 2 (two) times daily.     lidocaine-prilocaine (EMLA) cream Apply 1 application topically as needed. 30 g 3   Multiple Vitamin (MULTIVITAMIN) tablet Take 1 tablet by mouth daily.     Omega-3 Fatty Acids (FISH OIL) 1000 MG CAPS Take 1 capsule by mouth daily.     ondansetron (ZOFRAN) 8 MG tablet Take 1 tablet (8 mg total) by mouth 2 (two) times daily as needed for refractory nausea / vomiting. 60 tablet 1   polyethylene glycol (MIRALAX / GLYCOLAX) 17 g packet Take 17  g by mouth daily.     prochlorperazine (COMPAZINE) 10 MG tablet Take 1 tablet (10 mg total) by mouth every 6 (six) hours as needed (Nausea or vomiting). 60 tablet 1   No current facility-administered medications for this visit.    OBJECTIVE: Vitals:   12/15/20 0922  BP: 139/86  Pulse: 80  Resp: 18  Temp: 98.7 F (37.1 C)  SpO2: 98%     Body mass index is 39.58 kg/m.    ECOG FS:0 - Asymptomatic  General: Well-developed, well-nourished, no acute distress. Eyes: Pink conjunctiva, anicteric  sclera. HEENT: Normocephalic, moist mucous membranes. Lungs: No audible wheezing or coughing. Heart: Regular rate and rhythm. Abdomen: Soft, nontender, no obvious distention. Musculoskeletal: No edema, cyanosis, or clubbing. Neuro: Alert, answering all questions appropriately. Cranial nerves grossly intact. Skin: No rashes or petechiae noted. Psych: Normal affect.   LAB RESULTS:  Lab Results  Component Value Date   NA 136 12/15/2020   K 3.6 12/15/2020   CL 104 12/15/2020   CO2 27 12/15/2020   GLUCOSE 116 (H) 12/15/2020   BUN 13 12/15/2020   CREATININE 0.83 12/15/2020   CALCIUM 10.6 (H) 12/15/2020   PROT 7.0 12/15/2020   ALBUMIN 3.4 (L) 12/15/2020   AST 38 12/15/2020   ALT 24 12/15/2020   ALKPHOS 82 12/15/2020   BILITOT 0.8 12/15/2020   GFRNONAA >60 12/15/2020    Lab Results  Component Value Date   WBC 3.6 (L) 12/15/2020   NEUTROABS 1.3 (L) 12/15/2020   HGB 13.6 12/15/2020   HCT 39.9 12/15/2020   MCV 98.8 12/15/2020   PLT 115 (L) 12/15/2020     STUDIES: No results found.  ASSESSMENT: Colon cancer metastasized to liver.  PLAN:    1. Colon cancer metastasized to liver: Patient initially presented to [facility] on on July 28, 2020 and underwent surgical intervention for metastatic colon cancer to the liver.  She was taken to the operating room by Cameron Sprang MD and Binnie Rail MD who performed a robot assisted laparoscopy, robot-assisted partial hepatectomy (Segment 4A/8), microwave ablation (x2, segments 7, 7/8), intraoperative ultrasound and robotic low anterior resection. Port has been placed.  Repeat colonoscopy at [facility] in June 2022 was reported as negative.  Proceed with cycle 8 of 12 of adjuvant FOLFOX today.  We will dose reduce oxaliplatin 10% secondary to persistent peripheral neuropathy.  Return to clinic in 2 days for pump removal and then in 2 weeks for further evaluation and consideration of cycle 9. 2.  Genetics  referral: Negative. 3.  Polycythemia: Resolved. 4.  Hypercalcemia: Improved.  Patient's calcium is 10.6 today. 5.  Leukopenia: Chronic and unchanged. 6.  Thrombocytopenia: Platelets have trended down slightly to 115.  Proceed with treatment as above. 7.  Anxiety: Continue 0.25 alprazolam as needed. 8.  Peripheral neuropathy: We will dose reduce oxaliplatin him by 10% as above. 9.  Pulm erythema: Patient does not complain of this today.  Patient expressed understanding and was in agreement with this plan. She also understands that She can call clinic at any time with any questions, concerns, or complaints.   Cancer Staging Colon cancer [facility]) Staging form: Colon and Rectum, AJCC 8th Edition - Clinical stage from 08/15/2020: Stage IVA (cTX, cNX, pM1a) - Signed by Lloyd Huger, MD on 08/15/2020   Lloyd Huger, MD   12/15/2020 10:10 AM

## 2020-12-15 ENCOUNTER — Encounter: Payer: Self-pay | Admitting: Oncology

## 2020-12-15 ENCOUNTER — Inpatient Hospital Stay: Payer: BC Managed Care – PPO | Attending: Oncology

## 2020-12-15 ENCOUNTER — Inpatient Hospital Stay (HOSPITAL_BASED_OUTPATIENT_CLINIC_OR_DEPARTMENT_OTHER): Payer: BC Managed Care – PPO | Admitting: Oncology

## 2020-12-15 ENCOUNTER — Inpatient Hospital Stay: Payer: BC Managed Care – PPO

## 2020-12-15 VITALS — BP 139/86 | HR 80 | Temp 98.7°F | Resp 18 | Ht 69.0 in | Wt 268.0 lb

## 2020-12-15 DIAGNOSIS — C787 Secondary malignant neoplasm of liver and intrahepatic bile duct: Secondary | ICD-10-CM | POA: Diagnosis present

## 2020-12-15 DIAGNOSIS — F419 Anxiety disorder, unspecified: Secondary | ICD-10-CM | POA: Diagnosis not present

## 2020-12-15 DIAGNOSIS — Z5111 Encounter for antineoplastic chemotherapy: Secondary | ICD-10-CM | POA: Insufficient documentation

## 2020-12-15 DIAGNOSIS — G620 Drug-induced polyneuropathy: Secondary | ICD-10-CM | POA: Diagnosis not present

## 2020-12-15 DIAGNOSIS — C189 Malignant neoplasm of colon, unspecified: Secondary | ICD-10-CM

## 2020-12-15 DIAGNOSIS — Z803 Family history of malignant neoplasm of breast: Secondary | ICD-10-CM | POA: Insufficient documentation

## 2020-12-15 DIAGNOSIS — D696 Thrombocytopenia, unspecified: Secondary | ICD-10-CM | POA: Insufficient documentation

## 2020-12-15 DIAGNOSIS — D72819 Decreased white blood cell count, unspecified: Secondary | ICD-10-CM | POA: Diagnosis not present

## 2020-12-15 DIAGNOSIS — Z8051 Family history of malignant neoplasm of kidney: Secondary | ICD-10-CM | POA: Diagnosis not present

## 2020-12-15 DIAGNOSIS — Z79899 Other long term (current) drug therapy: Secondary | ICD-10-CM | POA: Diagnosis not present

## 2020-12-15 DIAGNOSIS — Z8042 Family history of malignant neoplasm of prostate: Secondary | ICD-10-CM | POA: Diagnosis not present

## 2020-12-15 DIAGNOSIS — Z806 Family history of leukemia: Secondary | ICD-10-CM | POA: Diagnosis not present

## 2020-12-15 LAB — CBC WITH DIFFERENTIAL/PLATELET
Abs Immature Granulocytes: 0.02 10*3/uL (ref 0.00–0.07)
Basophils Absolute: 0 10*3/uL (ref 0.0–0.1)
Basophils Relative: 1 %
Eosinophils Absolute: 0.2 10*3/uL (ref 0.0–0.5)
Eosinophils Relative: 6 %
HCT: 39.9 % (ref 36.0–46.0)
Hemoglobin: 13.6 g/dL (ref 12.0–15.0)
Immature Granulocytes: 1 %
Lymphocytes Relative: 38 %
Lymphs Abs: 1.4 10*3/uL (ref 0.7–4.0)
MCH: 33.7 pg (ref 26.0–34.0)
MCHC: 34.1 g/dL (ref 30.0–36.0)
MCV: 98.8 fL (ref 80.0–100.0)
Monocytes Absolute: 0.7 10*3/uL (ref 0.1–1.0)
Monocytes Relative: 19 %
Neutro Abs: 1.3 10*3/uL — ABNORMAL LOW (ref 1.7–7.7)
Neutrophils Relative %: 35 %
Platelets: 115 10*3/uL — ABNORMAL LOW (ref 150–400)
RBC: 4.04 MIL/uL (ref 3.87–5.11)
RDW: 17.5 % — ABNORMAL HIGH (ref 11.5–15.5)
WBC: 3.6 10*3/uL — ABNORMAL LOW (ref 4.0–10.5)
nRBC: 0 % (ref 0.0–0.2)

## 2020-12-15 LAB — COMPREHENSIVE METABOLIC PANEL
ALT: 24 U/L (ref 0–44)
AST: 38 U/L (ref 15–41)
Albumin: 3.4 g/dL — ABNORMAL LOW (ref 3.5–5.0)
Alkaline Phosphatase: 82 U/L (ref 38–126)
Anion gap: 5 (ref 5–15)
BUN: 13 mg/dL (ref 6–20)
CO2: 27 mmol/L (ref 22–32)
Calcium: 10.6 mg/dL — ABNORMAL HIGH (ref 8.9–10.3)
Chloride: 104 mmol/L (ref 98–111)
Creatinine, Ser: 0.83 mg/dL (ref 0.44–1.00)
GFR, Estimated: >60 mL/min (ref >60)
Glucose, Bld: 116 mg/dL — ABNORMAL HIGH (ref 70–99)
Potassium: 3.6 mmol/L (ref 3.5–5.1)
Sodium: 136 mmol/L (ref 135–145)
Total Bilirubin: 0.8 mg/dL (ref 0.3–1.2)
Total Protein: 7 g/dL (ref 6.5–8.1)

## 2020-12-15 MED ORDER — OXALIPLATIN CHEMO INJECTION 100 MG/20ML
75.0000 mg/m2 | Freq: Once | INTRAVENOUS | Status: AC
  Administered 2020-12-15: 180 mg via INTRAVENOUS
  Filled 2020-12-15: qty 36

## 2020-12-15 MED ORDER — LEUCOVORIN CALCIUM INJECTION 350 MG
950.0000 mg | Freq: Once | INTRAVENOUS | Status: AC
  Administered 2020-12-15: 950 mg via INTRAVENOUS
  Filled 2020-12-15: qty 47.5

## 2020-12-15 MED ORDER — SODIUM CHLORIDE 0.9 % IV SOLN
10.0000 mg | Freq: Once | INTRAVENOUS | Status: AC
  Administered 2020-12-15: 10 mg via INTRAVENOUS
  Filled 2020-12-15: qty 10

## 2020-12-15 MED ORDER — SODIUM CHLORIDE 0.9 % IV SOLN
2400.0000 mg/m2 | INTRAVENOUS | Status: DC
  Administered 2020-12-15: 5800 mg via INTRAVENOUS
  Filled 2020-12-15: qty 116

## 2020-12-15 MED ORDER — FLUOROURACIL CHEMO INJECTION 2.5 GM/50ML
400.0000 mg/m2 | Freq: Once | INTRAVENOUS | Status: AC
  Administered 2020-12-15: 950 mg via INTRAVENOUS
  Filled 2020-12-15: qty 19

## 2020-12-15 MED ORDER — DEXTROSE 5 % IV SOLN
Freq: Once | INTRAVENOUS | Status: AC
  Filled 2020-12-15: qty 250

## 2020-12-15 MED ORDER — PALONOSETRON HCL INJECTION 0.25 MG/5ML
0.2500 mg | Freq: Once | INTRAVENOUS | Status: AC
  Administered 2020-12-15: 0.25 mg via INTRAVENOUS
  Filled 2020-12-15: qty 5

## 2020-12-15 NOTE — Patient Instructions (Signed)
C[facility]ER CENTER [place] [facility]  Discharge Instructions: Thank you for choosing [facility] to provide your oncology and hematology care.  If you have a lab appointment with the Cancer Center, please go directly to the Cancer Center and check in at the registration area.  Wear comfortable clothing and clothing appropriate for easy access to any Portacath or PICC line.   We strive to give you quality time with your provider. You may need to reschedule your appointment if you arrive late (15 or more minutes).  Arriving late affects you and other patients whose appointments are after yours.  Also, if you miss three or more appointments without notifying the office, you may be dismissed from the clinic at the provider's discretion.      For prescription refill requests, have your pharmacy contact our office and allow 72 hours for refills to be completed.    Today you received the following chemotherapy and/or immunotherapy agents Oxaliplatin, Leucovorin and Adrucil       To help prevent nausea and vomiting after your treatment, we encourage you to take your nausea medication as directed.  BELOW ARE SYMPTOMS THAT SHOULD BE REPORTED IMMEDIATELY: *FEVER GREATER THAN 100.4 F (38 C) OR HIGHER *CHILLS OR SWEATING *NAUSEA AND VOMITING THAT IS NOT CONTROLLED WITH YOUR NAUSEA MEDICATION *UNUSUAL SHORTNESS OF BREATH *UNUSUAL BRUISING OR BLEEDING *URINARY PROBLEMS (pain or burning when urinating, or frequent urination) *BOWEL PROBLEMS (unusual diarrhea, constipation, pain near the anus) TENDERNESS IN MOUTH AND THROAT WITH OR WITHOUT PRESENCE OF ULCERS (sore throat, sores in mouth, or a toothache) UNUSUAL RASH, SWELLING OR PAIN  UNUSUAL VAGINAL DISCHARGE OR ITCHING   Items with * indicate a potential emergency and should be followed up as soon as possible or go to the Emergency Department if any problems should occur.  Please show the CHEMOTHERAPY ALERT CARD or  IMMUNOTHERAPY ALERT CARD at check-in to the Emergency Department and triage nurse.  Should you have questions after your visit or need to cancel or reschedule your appointment, please contact [facility] [place] [facility]  [phone] and follow the prompts.  Office hours are 8:00 a.m. to 4:30 p.m. Monday - Friday. Please note that voicemails left after 4:00 p.m. may not be returned until the following business day.  We are closed weekends and major holidays. You have access to a nurse at all times for urgent questions. Please call the main number to the clinic [phone] and follow the prompts.  For any non-urgent questions, you may also contact your provider using MyChart. We now offer e-Visits for anyone 18 and older to request care online for non-urgent symptoms. For details visit [email or web address].   Also download the MyChart app! Go to the app store, search "MyChart", open the app, select [facility], and log in with your MyChart username and password.  Due to Covid, a mask is required upon entering the hospital/clinic. If you do not have a mask, one will be given to you upon arrival. For doctor visits, patients may have 1 support person aged 18 or older with them. For treatment visits, patients cannot have anyone with them due to current Covid guidelines and our immunocompromised population.  

## 2020-12-17 ENCOUNTER — Other Ambulatory Visit: Payer: Self-pay

## 2020-12-17 ENCOUNTER — Inpatient Hospital Stay: Payer: BC Managed Care – PPO

## 2020-12-17 VITALS — BP 124/89 | HR 68 | Temp 97.8°F | Resp 18

## 2020-12-17 DIAGNOSIS — C189 Malignant neoplasm of colon, unspecified: Secondary | ICD-10-CM | POA: Diagnosis not present

## 2020-12-17 MED ORDER — HEPARIN SOD (PORK) LOCK FLUSH 100 UNIT/ML IV SOLN
500.0000 [IU] | Freq: Once | INTRAVENOUS | Status: AC | PRN
  Administered 2020-12-17: 500 [IU]
  Filled 2020-12-17: qty 5

## 2020-12-17 MED ORDER — SODIUM CHLORIDE 0.9% FLUSH
10.0000 mL | INTRAVENOUS | Status: DC | PRN
  Administered 2020-12-17: 10 mL
  Filled 2020-12-17: qty 10

## 2020-12-17 NOTE — Progress Notes (Signed)
Patient here for pump d/c and c/o bilateral hand edema and erythema. Also reports facial edema. Patient reports Dr. Grayland Ormond has been made aware of this as it has happened in the past but reports symptoms seem worse this time. Dr. Grayland Ormond and team made aware. Per Dr. Grayland Ormond, patient is to be seen by [facility] today or tomorrow. Informed patient of this information and patient does not wish to stay today and reports if symptoms remain the same in the AM, she will contact clinic to schedule an appt. With [facility]. Patient educated on s/s as to when to seek emergency care. Patient verbalizes understanding and denies any further questions or concerns.

## 2020-12-25 NOTE — Progress Notes (Signed)
[facility]  Telephone:([phone] Fax:(336) [phone]  ID: [name] OB: May 21, 1970  MR#: [id number]  [id number]  Patient Care Team: Kirk Ruths, MD as PCP - General (Internal Medicine)    CHIEF COMPLAINT: Colon cancer metastasized to liver.  INTERVAL HISTORY: Patient returns to clinic today for further evaluation and consideration of cycle 9 of FOLFOX.  Her neuropathy is slightly worse this week particularly in her feet.  She otherwise feels well.  She denies any pain.  She has no other neurologic complaints.  She denies any recent fevers or illnesses.  She has a fair appetite and denies weight loss.  She has no chest pain, shortness of breath, cough, or hemoptysis.  She denies any nausea, vomiting, constipation, or diarrhea.  She has no further melena or hematochezia.  She has no urinary complaints.  Patient offers no further specific complaints today.  REVIEW OF SYSTEMS:   Review of Systems  Constitutional: Negative.  Negative for fever, malaise/fatigue and weight loss.  Respiratory: Negative.  Negative for cough, hemoptysis and shortness of breath.   Cardiovascular: Negative.  Negative for chest pain and leg swelling.  Gastrointestinal: Negative.  Negative for abdominal pain, blood in stool and melena.  Genitourinary: Negative.  Negative for dysuria.  Musculoskeletal: Negative.  Negative for back pain.  Skin: Negative.  Negative for rash.  Neurological:  Positive for tingling and sensory change. Negative for dizziness, seizures, weakness and headaches.  Psychiatric/Behavioral: Negative.  The patient is not nervous/anxious.    As per HPI. Otherwise, a complete review of systems is negative.  PAST MEDICAL HISTORY: Past Medical History:  Diagnosis Date   Colon cancer ([facility]) 08/15/2020   Family history of kidney cancer    Family history of leukemia    Family history of prostate cancer     PAST SURGICAL HISTORY: History reviewed. No pertinent  surgical history.  FAMILY HISTORY: Family History  Problem Relation Age of Onset   Breast cancer Cousin        pat cousin   Prostate cancer Mother 3   Kidney cancer Maternal Uncle 58   Leukemia Maternal Grandfather 65    ADVANCED DIRECTIVES (Y/N):  N  HEALTH MAINTENANCE: Social History   Tobacco Use   Smoking status: Never   Smokeless tobacco: Never  Substance Use Topics   Alcohol use: Never   Drug use: Never     Colonoscopy:  PAP:  Bone density:  Lipid panel:  No Known Allergies  Current Outpatient Medications  Medication Sig Dispense Refill   ALPRAZolam (XANAX) 0.25 MG tablet Take 1 tablet (0.25 mg total) by mouth at bedtime as needed for anxiety. 30 tablet 0   cetirizine (ZYRTEC) 10 MG tablet Take 1 tablet by mouth daily.     cholecalciferol (VITAMIN D3) 25 MCG (1000 UNIT) tablet Take 1,000 Units by mouth daily.     docusate sodium (COLACE) 100 MG capsule Take 100 mg by mouth 2 (two) times daily.     lidocaine-prilocaine (EMLA) cream Apply 1 application topically as needed. 30 g 3   Multiple Vitamin (MULTIVITAMIN) tablet Take 1 tablet by mouth daily.     Omega-3 Fatty Acids (FISH OIL) 1000 MG CAPS Take 1 capsule by mouth daily.     ondansetron (ZOFRAN) 8 MG tablet Take 1 tablet (8 mg total) by mouth 2 (two) times daily as needed for refractory nausea / vomiting. 60 tablet 1   polyethylene glycol (MIRALAX / GLYCOLAX) 17 g packet Take 17 g by mouth daily.  prochlorperazine (COMPAZINE) 10 MG tablet Take 1 tablet (10 mg total) by mouth every 6 (six) hours as needed (Nausea or vomiting). 60 tablet 1   No current facility-administered medications for this visit.   Facility-Administered Medications Ordered in Other Visits  Medication Dose Route Frequency Provider Last Rate Last Admin   sodium chloride flush (NS) 0.9 % injection 10 mL  10 mL Intravenous PRN Sindy Guadeloupe, MD   10 mL at 12/29/20 0958    OBJECTIVE: Vitals:   12/29/20 0903  BP: (!) 145/90  Pulse:  76  Resp: 16  Temp: 98.7 F (37.1 C)     Body mass index is 39.65 kg/m.    ECOG FS:0 - Asymptomatic  General: Well-developed, well-nourished, no acute distress. Eyes: Pink conjunctiva, anicteric sclera. HEENT: Normocephalic, moist mucous membranes. Lungs: No audible wheezing or coughing. Heart: Regular rate and rhythm. Abdomen: Soft, nontender, no obvious distention. Musculoskeletal: No edema, cyanosis, or clubbing. Neuro: Alert, answering all questions appropriately. Cranial nerves grossly intact. Skin: No rashes or petechiae noted. Psych: Normal affect.  LAB RESULTS:  Lab Results  Component Value Date   NA 136 12/29/2020   K 3.4 (L) 12/29/2020   CL 105 12/29/2020   CO2 26 12/29/2020   GLUCOSE 132 (H) 12/29/2020   BUN 12 12/29/2020   CREATININE 0.87 12/29/2020   CALCIUM 11.3 (H) 12/29/2020   PROT 7.1 12/29/2020   ALBUMIN 3.6 12/29/2020   AST 41 12/29/2020   ALT 26 12/29/2020   ALKPHOS 97 12/29/2020   BILITOT 0.7 12/29/2020   GFRNONAA >60 12/29/2020    Lab Results  Component Value Date   WBC 3.3 (L) 12/29/2020   NEUTROABS 0.8 (L) 12/29/2020   HGB 13.3 12/29/2020   HCT 38.9 12/29/2020   MCV 99.7 12/29/2020   PLT 109 (L) 12/29/2020     STUDIES: No results found.  ASSESSMENT: Colon cancer metastasized to liver.  PLAN:    1. Colon cancer metastasized to liver: Patient initially presented to [facility] on on July 28, 2020 and underwent surgical intervention for metastatic colon cancer to the liver.  She was taken to the operating room by Cameron Sprang MD and Binnie Rail MD who performed a robot assisted laparoscopy, robot-assisted partial hepatectomy (Segment 4A/8), microwave ablation (x2, segments 7, 7/8), intraoperative ultrasound and robotic low anterior resection. Port has been placed.  Repeat colonoscopy at [facility] in June 2022 was reported as negative.  Delay cycle 9 of treatment secondary to persistent neuropathy and  neutropenia.  Consider dose reducing oxaliplatin again if neuropathy does not improve.  Return to clinic in 1 week for further evaluation and reconsideration of cycle 9.   2.  Genetics referral: Negative. 3.  Polycythemia: Resolved. 4.  Hypercalcemia: Chronic and unchanged.  Patient's calcium is 11.3 today. 5.  Neutropenia: Delay treatment as above.  Consider adding Neulasta or bio similar in the future. 6.  Thrombocytopenia: Chronic and unchanged.  Patient's platelet count was 109.  Delay treatment as above.   7.  Anxiety: Continue 0.25 alprazolam as needed. 8.  Peripheral neuropathy: Previously, oxaliplatin was dose reduced by 10%.  Delay treatment 1 week as above.  Patient expressed understanding and was in agreement with this plan. She also understands that She can call clinic at any time with any questions, concerns, or complaints.   Cancer Staging Colon cancer [facility]) Staging form: Colon and Rectum, AJCC 8th Edition - Clinical stage from 08/15/2020: Stage IVA (cTX, cNX, pM1a) - Signed by Delight Hoh  J, MD on 08/15/2020   Lloyd Huger, MD   12/29/2020 12:58 PM

## 2020-12-29 ENCOUNTER — Inpatient Hospital Stay: Payer: BC Managed Care – PPO

## 2020-12-29 ENCOUNTER — Inpatient Hospital Stay (HOSPITAL_BASED_OUTPATIENT_CLINIC_OR_DEPARTMENT_OTHER): Payer: BC Managed Care – PPO | Admitting: Oncology

## 2020-12-29 ENCOUNTER — Encounter: Payer: Self-pay | Admitting: Oncology

## 2020-12-29 VITALS — BP 145/90 | HR 76 | Temp 98.7°F | Resp 16 | Wt 268.5 lb

## 2020-12-29 DIAGNOSIS — C189 Malignant neoplasm of colon, unspecified: Secondary | ICD-10-CM

## 2020-12-29 LAB — COMPREHENSIVE METABOLIC PANEL
ALT: 26 U/L (ref 0–44)
AST: 41 U/L (ref 15–41)
Albumin: 3.6 g/dL (ref 3.5–5.0)
Alkaline Phosphatase: 97 U/L (ref 38–126)
Anion gap: 5 (ref 5–15)
BUN: 12 mg/dL (ref 6–20)
CO2: 26 mmol/L (ref 22–32)
Calcium: 11.3 mg/dL — ABNORMAL HIGH (ref 8.9–10.3)
Chloride: 105 mmol/L (ref 98–111)
Creatinine, Ser: 0.87 mg/dL (ref 0.44–1.00)
GFR, Estimated: >60 mL/min (ref >60)
Glucose, Bld: 132 mg/dL — ABNORMAL HIGH (ref 70–99)
Potassium: 3.4 mmol/L — ABNORMAL LOW (ref 3.5–5.1)
Sodium: 136 mmol/L (ref 135–145)
Total Bilirubin: 0.7 mg/dL (ref 0.3–1.2)
Total Protein: 7.1 g/dL (ref 6.5–8.1)

## 2020-12-29 LAB — CBC WITH DIFFERENTIAL/PLATELET
Abs Immature Granulocytes: 0 10*3/uL (ref 0.00–0.07)
Basophils Absolute: 0 10*3/uL (ref 0.0–0.1)
Basophils Relative: 1 %
Eosinophils Absolute: 0.2 10*3/uL (ref 0.0–0.5)
Eosinophils Relative: 7 %
HCT: 38.9 % (ref 36.0–46.0)
Hemoglobin: 13.3 g/dL (ref 12.0–15.0)
Immature Granulocytes: 0 %
Lymphocytes Relative: 46 %
Lymphs Abs: 1.6 10*3/uL (ref 0.7–4.0)
MCH: 34.1 pg — ABNORMAL HIGH (ref 26.0–34.0)
MCHC: 34.2 g/dL (ref 30.0–36.0)
MCV: 99.7 fL (ref 80.0–100.0)
Monocytes Absolute: 0.7 10*3/uL (ref 0.1–1.0)
Monocytes Relative: 21 %
Neutro Abs: 0.8 10*3/uL — ABNORMAL LOW (ref 1.7–7.7)
Neutrophils Relative %: 25 %
Platelets: 109 10*3/uL — ABNORMAL LOW (ref 150–400)
RBC: 3.9 MIL/uL (ref 3.87–5.11)
RDW: 17.3 % — ABNORMAL HIGH (ref 11.5–15.5)
WBC: 3.3 10*3/uL — ABNORMAL LOW (ref 4.0–10.5)
nRBC: 0 % (ref 0.0–0.2)

## 2020-12-29 MED ORDER — HEPARIN SOD (PORK) LOCK FLUSH 100 UNIT/ML IV SOLN
500.0000 [IU] | Freq: Once | INTRAVENOUS | Status: AC
  Filled 2020-12-29: qty 5

## 2020-12-29 MED ORDER — HEPARIN SOD (PORK) LOCK FLUSH 100 UNIT/ML IV SOLN
INTRAVENOUS | Status: AC
  Administered 2020-12-29: 500 [IU] via INTRAVENOUS
  Filled 2020-12-29: qty 5

## 2020-12-29 MED ORDER — SODIUM CHLORIDE 0.9% FLUSH
10.0000 mL | INTRAVENOUS | Status: DC | PRN
  Administered 2020-12-29 (×2): 10 mL via INTRAVENOUS
  Filled 2020-12-29: qty 10

## 2020-12-29 NOTE — Progress Notes (Signed)
Patient still having the neuropathy symptoms since her last treatment and now feels neuropathy on tip of her nose.  Has started dropping things and difficulty walking due to neuropathy.  A few days after treatment felt she had trouble swallowing.

## 2020-12-31 ENCOUNTER — Inpatient Hospital Stay: Payer: BC Managed Care – PPO

## 2020-12-31 NOTE — Progress Notes (Signed)
[facility]  Telephone:(336) [phone] Fax:(336) [phone]  ID: [name] OB: 07/25/1970  MR#: [id number]  [id number]  Patient Care Team: Kirk Ruths, MD as PCP - General (Internal Medicine)    CHIEF COMPLAINT: Colon cancer metastasized to liver.  INTERVAL HISTORY: Patient returns to clinic today for further evaluation and reconsideration of cycle 9 of FOLFOX.  Her neuropathy is unchanged and possibly slightly worse this week.  She otherwise feels well.  She denies any pain.  She has no other neurologic complaints.  She denies any recent fevers or illnesses.  She has a fair appetite and denies weight loss.  She has no chest pain, shortness of breath, cough, or hemoptysis.  She denies any nausea, vomiting, constipation, or diarrhea.  She has no further melena or hematochezia.  She has no urinary complaints.  Patient offers no further specific complaints today.  REVIEW OF SYSTEMS:   Review of Systems  Constitutional: Negative.  Negative for fever, malaise/fatigue and weight loss.  Respiratory: Negative.  Negative for cough, hemoptysis and shortness of breath.   Cardiovascular: Negative.  Negative for chest pain and leg swelling.  Gastrointestinal: Negative.  Negative for abdominal pain, blood in stool and melena.  Genitourinary: Negative.  Negative for dysuria.  Musculoskeletal: Negative.  Negative for back pain.  Skin: Negative.  Negative for rash.  Neurological:  Positive for tingling and sensory change. Negative for dizziness, seizures, weakness and headaches.  Psychiatric/Behavioral: Negative.  The patient is not nervous/anxious.    As per HPI. Otherwise, a complete review of systems is negative.  PAST MEDICAL HISTORY: Past Medical History:  Diagnosis Date   Colon cancer ([place]) 08/15/2020   Family history of kidney cancer    Family history of leukemia    Family history of prostate cancer     PAST SURGICAL HISTORY: No past surgical history on  file.  FAMILY HISTORY: Family History  Problem Relation Age of Onset   Breast cancer Cousin        pat cousin   Prostate cancer Mother 30   Kidney cancer Maternal Uncle 8   Leukemia Maternal Grandfather 45    ADVANCED DIRECTIVES (Y/N):  N  HEALTH MAINTENANCE: Social History   Tobacco Use   Smoking status: Never   Smokeless tobacco: Never  Substance Use Topics   Alcohol use: Never   Drug use: Never     Colonoscopy:  PAP:  Bone density:  Lipid panel:  No Known Allergies  Current Outpatient Medications  Medication Sig Dispense Refill   ALPRAZolam (XANAX) 0.25 MG tablet Take 1 tablet (0.25 mg total) by mouth at bedtime as needed for anxiety. 30 tablet 0   cetirizine (ZYRTEC) 10 MG tablet Take 1 tablet by mouth daily.     cholecalciferol (VITAMIN D3) 25 MCG (1000 UNIT) tablet Take 1,000 Units by mouth daily.     docusate sodium (COLACE) 100 MG capsule Take 100 mg by mouth 2 (two) times daily.     lidocaine-prilocaine (EMLA) cream Apply 1 application topically as needed. 30 g 3   Multiple Vitamin (MULTIVITAMIN) tablet Take 1 tablet by mouth daily.     Omega-3 Fatty Acids (FISH OIL) 1000 MG CAPS Take 1 capsule by mouth daily.     polyethylene glycol (MIRALAX / GLYCOLAX) 17 g packet Take 17 g by mouth daily.     No current facility-administered medications for this visit.    OBJECTIVE: Vitals:   01/05/21 0920  BP: 139/83  Pulse: 79  Resp: 16  Temp: (!) 97.5 F (36.4 C)  SpO2: 100%     Body mass index is 40.17 kg/m.    ECOG FS:0 - Asymptomatic  General: Well-developed, well-nourished, no acute distress. Eyes: Pink conjunctiva, anicteric sclera. HEENT: Normocephalic, moist mucous membranes. Lungs: No audible wheezing or coughing. Heart: Regular rate and rhythm. Abdomen: Soft, nontender, no obvious distention. Musculoskeletal: No edema, cyanosis, or clubbing. Neuro: Alert, answering all questions appropriately. Cranial nerves grossly intact. Skin: No rashes or  petechiae noted. Psych: Normal affect.  LAB RESULTS:  Lab Results  Component Value Date   NA 136 01/05/2021   K 3.6 01/05/2021   CL 105 01/05/2021   CO2 26 01/05/2021   GLUCOSE 137 (H) 01/05/2021   BUN 14 01/05/2021   CREATININE 0.76 01/05/2021   CALCIUM 10.8 (H) 01/05/2021   PROT 6.9 01/05/2021   ALBUMIN 3.4 (L) 01/05/2021   AST 37 01/05/2021   ALT 23 01/05/2021   ALKPHOS 100 01/05/2021   BILITOT 0.6 01/05/2021   GFRNONAA >60 01/05/2021    Lab Results  Component Value Date   WBC 4.7 01/05/2021   NEUTROABS 1.9 01/05/2021   HGB 13.6 01/05/2021   HCT 39.6 01/05/2021   MCV 102.3 (H) 01/05/2021   PLT 182 01/05/2021     STUDIES: No results found.  ASSESSMENT: Colon cancer metastasized to liver.  PLAN:    1. Colon cancer metastasized to liver: Patient initially presented to [facility] on on July 28, 2020 and underwent surgical intervention for metastatic colon cancer to the liver.  She was taken to the operating room by Cameron Sprang MD and Binnie Rail MD who performed a robot assisted laparoscopy, robot-assisted partial hepatectomy (Segment 4A/8), microwave ablation (x2, segments 7, 7/8), intraoperative ultrasound and robotic low anterior resection. Port has been placed.  Repeat colonoscopy at [facility] in June 2022 was reported as negative.  Will discontinue FOLFOX altogether given patient's persistent peripheral neuropathy and she will return to clinic in 1 week to finish out her adjuvant treatment with FOLFIRI for cycles 9 through 12.   2.  Genetics referral: Negative. 3.  Polycythemia: Resolved. 4.  Hypercalcemia: Chronic and unchanged.  Patient's calcium is 10.8 today. 5.  Neutropenia: Resolved.  Return to clinic in 1 week for consideration of cycle 9 of 12.  Consider adding Neulasta or bio similar in the future. 6.  Thrombocytopenia: Resolved. 7.  Anxiety: Continue 0.25 alprazolam as needed. 8.  Peripheral neuropathy: Worse this week.   Switch to FOLFIRI as above.  Patient expressed understanding and was in agreement with this plan. She also understands that She can call clinic at any time with any questions, concerns, or complaints.   Cancer Staging Colon cancer [facility]) Staging form: Colon and Rectum, AJCC 8th Edition - Clinical stage from 08/15/2020: Stage IVA (cTX, cNX, pM1a) - Signed by Lloyd Huger, MD on 08/15/2020   Lloyd Huger, MD   01/05/2021 1:27 PM

## 2021-01-05 ENCOUNTER — Inpatient Hospital Stay (HOSPITAL_BASED_OUTPATIENT_CLINIC_OR_DEPARTMENT_OTHER): Payer: BC Managed Care – PPO | Admitting: Oncology

## 2021-01-05 ENCOUNTER — Encounter: Payer: Self-pay | Admitting: Oncology

## 2021-01-05 ENCOUNTER — Inpatient Hospital Stay: Payer: BC Managed Care – PPO

## 2021-01-05 VITALS — BP 139/83 | HR 79 | Temp 97.5°F | Resp 16 | Ht 69.0 in | Wt 272.0 lb

## 2021-01-05 DIAGNOSIS — C189 Malignant neoplasm of colon, unspecified: Secondary | ICD-10-CM

## 2021-01-05 LAB — CBC WITH DIFFERENTIAL/PLATELET
Abs Immature Granulocytes: 0.03 10*3/uL (ref 0.00–0.07)
Basophils Absolute: 0 10*3/uL (ref 0.0–0.1)
Basophils Relative: 0 %
Eosinophils Absolute: 0.2 10*3/uL (ref 0.0–0.5)
Eosinophils Relative: 5 %
HCT: 39.6 % (ref 36.0–46.0)
Hemoglobin: 13.6 g/dL (ref 12.0–15.0)
Immature Granulocytes: 1 %
Lymphocytes Relative: 36 %
Lymphs Abs: 1.7 10*3/uL (ref 0.7–4.0)
MCH: 35.1 pg — ABNORMAL HIGH (ref 26.0–34.0)
MCHC: 34.3 g/dL (ref 30.0–36.0)
MCV: 102.3 fL — ABNORMAL HIGH (ref 80.0–100.0)
Monocytes Absolute: 0.8 10*3/uL (ref 0.1–1.0)
Monocytes Relative: 18 %
Neutro Abs: 1.9 10*3/uL (ref 1.7–7.7)
Neutrophils Relative %: 40 %
Platelets: 182 10*3/uL (ref 150–400)
RBC: 3.87 MIL/uL (ref 3.87–5.11)
RDW: 17.1 % — ABNORMAL HIGH (ref 11.5–15.5)
WBC: 4.7 10*3/uL (ref 4.0–10.5)
nRBC: 0 % (ref 0.0–0.2)

## 2021-01-05 LAB — COMPREHENSIVE METABOLIC PANEL
ALT: 23 U/L (ref 0–44)
AST: 37 U/L (ref 15–41)
Albumin: 3.4 g/dL — ABNORMAL LOW (ref 3.5–5.0)
Alkaline Phosphatase: 100 U/L (ref 38–126)
Anion gap: 5 (ref 5–15)
BUN: 14 mg/dL (ref 6–20)
CO2: 26 mmol/L (ref 22–32)
Calcium: 10.8 mg/dL — ABNORMAL HIGH (ref 8.9–10.3)
Chloride: 105 mmol/L (ref 98–111)
Creatinine, Ser: 0.76 mg/dL (ref 0.44–1.00)
GFR, Estimated: >60 mL/min (ref >60)
Glucose, Bld: 137 mg/dL — ABNORMAL HIGH (ref 70–99)
Potassium: 3.6 mmol/L (ref 3.5–5.1)
Sodium: 136 mmol/L (ref 135–145)
Total Bilirubin: 0.6 mg/dL (ref 0.3–1.2)
Total Protein: 6.9 g/dL (ref 6.5–8.1)

## 2021-01-05 MED ORDER — SODIUM CHLORIDE 0.9% FLUSH
10.0000 mL | Freq: Once | INTRAVENOUS | Status: AC
  Administered 2021-01-05: 10 mL via INTRAVENOUS
  Filled 2021-01-05: qty 10

## 2021-01-05 MED ORDER — HEPARIN SOD (PORK) LOCK FLUSH 100 UNIT/ML IV SOLN
INTRAVENOUS | Status: AC
  Filled 2021-01-05: qty 5

## 2021-01-05 NOTE — Progress Notes (Signed)
Still having numbness and tingling in hands and feet that is still bothering her. Also states she has been having nightmares which is unusual for her. Also mentioned dropping things more which has gotten worse. Weak urine stream.

## 2021-01-05 NOTE — Progress Notes (Signed)
DISCONTINUE OFF PATHWAY REGIMEN - Colorectal   [id number] (Leucovorin IV D1 + Fluorouracil IV D1/CIV D1,2 + Oxaliplatin IV D1) q14 Days:   A cycle is every 14 days:     Oxaliplatin      Leucovorin      Fluorouracil      Fluorouracil   **Always confirm dose/schedule in your pharmacy ordering system**  REASON: Toxicities / Adverse Event PRIOR TREATMENT: Off Pathway: [id number] (Leucovorin IV D1 + Fluorouracil IV D1/CIV D1,2 + Oxaliplatin IV D1) q14 Days TREATMENT RESPONSE: Unable to Evaluate  START OFF PATHWAY REGIMEN - Colorectal   [id number] (Leucovorin IV D1 + Fluorouracil IV D1/CIV D1,2 + Irinotecan IV D1) q14 Days:   A cycle is every 14 days:     Irinotecan      Leucovorin      Fluorouracil      Fluorouracil   **Always confirm dose/schedule in your pharmacy ordering system**  Patient Characteristics: Postoperative without Neoadjuvant Therapy (Pathologic Staging), Distant Metastasis Tumor Location: Colon Therapeutic Status: Postoperative without Neoadjuvant Therapy (Pathologic Staging) AJCC M Category: pM1c AJCC T Category: pTX AJCC N Category: pNX AJCC 8 Stage Grouping: IVC Intent of Therapy: Curative Intent, Discussed with Patient

## 2021-01-07 ENCOUNTER — Inpatient Hospital Stay: Payer: BC Managed Care – PPO

## 2021-01-08 NOTE — Progress Notes (Signed)
[place]  [phone] Fax:(336) [phone]  ID: Sherri Howell OB: 27-Jan-1971  MR#: [id number]  [id number]  Patient Care Team: Kirk Ruths, MD as PCP - General (Internal Medicine)    CHIEF COMPLAINT: Colon cancer metastasized to liver.  INTERVAL HISTORY: Patient returns to clinic today for further evaluation and reconsideration of cycle 9 with FOLFIRI.  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  She denies any pain.  She has no other neurologic complaints.  She denies any recent fevers or illnesses.  She has a fair appetite and denies weight loss.  She has no chest pain, shortness of breath, cough, or hemoptysis.  She denies any nausea, vomiting, constipation, or diarrhea.  She has no further melena or hematochezia.  She has no urinary complaints.  Patient offers no further specific complaints today.    REVIEW OF SYSTEMS:   Review of Systems  Constitutional: Negative.  Negative for fever, malaise/fatigue and weight loss.  Respiratory: Negative.  Negative for cough, hemoptysis and shortness of breath.   Cardiovascular: Negative.  Negative for chest pain and leg swelling.  Gastrointestinal: Negative.  Negative for abdominal pain, blood in stool and melena.  Genitourinary: Negative.  Negative for dysuria.  Musculoskeletal: Negative.  Negative for back pain.  Skin: Negative.  Negative for rash.  Neurological:  Positive for tingling and sensory change. Negative for dizziness, seizures, weakness and headaches.  Psychiatric/Behavioral: Negative.  The patient is not nervous/anxious.    As per HPI. Otherwise, a complete review of systems is negative.  PAST MEDICAL HISTORY: Past Medical History:  Diagnosis Date   Colon cancer ([place]) 08/15/2020   Family history of kidney cancer    Family history of leukemia    Family history of prostate cancer     PAST SURGICAL HISTORY: No past surgical history on  file.  FAMILY HISTORY: Family History  Problem Relation Age of Onset   Breast cancer Cousin        pat cousin   Prostate cancer Mother 73   Kidney cancer Maternal Uncle 32   Leukemia Maternal Grandfather 35    ADVANCED DIRECTIVES (Y/N):  N  HEALTH MAINTENANCE: Social History   Tobacco Use   Smoking status: Never   Smokeless tobacco: Never  Substance Use Topics   Alcohol use: Never   Drug use: Never     Colonoscopy:  PAP:  Bone density:  Lipid panel:  No Known Allergies  Current Outpatient Medications  Medication Sig Dispense Refill   ALPRAZolam (XANAX) 0.25 MG tablet Take 1 tablet (0.25 mg total) by mouth at bedtime as needed for anxiety. 30 tablet 0   cetirizine (ZYRTEC) 10 MG tablet Take 1 tablet by mouth daily.     cholecalciferol (VITAMIN D3) 25 MCG (1000 UNIT) tablet Take 1,000 Units by mouth daily.     docusate sodium (COLACE) 100 MG capsule Take 100 mg by mouth 2 (two) times daily.     lidocaine-prilocaine (EMLA) cream Apply 1 application topically as needed. 30 g 3   Multiple Vitamin (MULTIVITAMIN) tablet Take 1 tablet by mouth daily.     Omega-3 Fatty Acids (FISH OIL) 1000 MG CAPS Take 1 capsule by mouth daily.     polyethylene glycol (MIRALAX / GLYCOLAX) 17 g packet Take 17 g by mouth daily.     No current facility-administered medications for this visit.    OBJECTIVE: Vitals:   01/13/21 1053  BP: 128/83  Pulse: 71  Resp: 18  Temp: 97.6  F (36.4 C)  SpO2: 100%     Body mass index is 40.76 kg/m.    ECOG FS:0 - Asymptomatic  General: Well-developed, well-nourished, no acute distress. Eyes: Pink conjunctiva, anicteric sclera. HEENT: Normocephalic, moist mucous membranes. Lungs: No audible wheezing or coughing. Heart: Regular rate and rhythm. Abdomen: Soft, nontender, no obvious distention. Musculoskeletal: No edema, cyanosis, or clubbing. Neuro: Alert, answering all questions appropriately. Cranial nerves grossly intact. Skin: No rashes or  petechiae noted. Psych: Normal affect.   LAB RESULTS:  Lab Results  Component Value Date   NA 134 (L) 01/13/2021   K 3.6 01/13/2021   CL 105 01/13/2021   CO2 24 01/13/2021   GLUCOSE 122 (H) 01/13/2021   BUN 15 01/13/2021   CREATININE 0.68 01/13/2021   CALCIUM 10.8 (H) 01/13/2021   PROT 7.2 01/13/2021   ALBUMIN 3.5 01/13/2021   AST 27 01/13/2021   ALT 17 01/13/2021   ALKPHOS 92 01/13/2021   BILITOT 0.5 01/13/2021   GFRNONAA >60 01/13/2021    Lab Results  Component Value Date   WBC 7.0 01/13/2021   NEUTROABS 4.1 01/13/2021   HGB 13.3 01/13/2021   HCT 39.5 01/13/2021   MCV 102.9 (H) 01/13/2021   PLT 145 (L) 01/13/2021     STUDIES: No results found.  ASSESSMENT: Colon cancer metastasized to liver.  PLAN:    1. Colon cancer metastasized to liver: Patient initially presented to [facility] on on July 28, 2020 and underwent surgical intervention for metastatic colon cancer to the liver.  She was taken to the operating room by Cameron Sprang MD and Binnie Rail MD who performed a robot assisted laparoscopy, robot-assisted partial hepatectomy (Segment 4A/8), microwave ablation (x2, segments 7, 7/8), intraoperative ultrasound and robotic low anterior resection. Port has been placed.  Repeat colonoscopy at [facility] in June 2022 was reported as negative.  FOLFOX was discontinued given patient's persistent peripheral neuropathy and will complete cycles 9 through 12 with FOLFIRI.  Proceed with cycle 9 of treatment today.  Return to clinic in 2 days for pump removal and then in 2 weeks for further evaluation and consideration of cycle 10.    2.  Genetics referral: Negative. 3.  Polycythemia: Resolved. 4.  Hypercalcemia: Chronic and unchanged.  Patient's calcium is 10.8 today. 5.  Neutropenia: Resolved.   Consider adding Neulasta or bio similar in the future. 6.  Thrombocytopenia: Mild, monitor.   7.  Anxiety: Continue 0.25 alprazolam as needed. 8.   Peripheral neuropathy: Switch to FOLFIRI as above.  Patient was also given a referral to neuro oncology.  Patient expressed understanding and was in agreement with this plan. She also understands that She can call clinic at any time with any questions, concerns, or complaints.   Cancer Staging Colon cancer [facility]) Staging form: Colon and Rectum, AJCC 8th Edition - Clinical stage from 08/15/2020: Stage IVA (cTX, cNX, pM1a) - Signed by Lloyd Huger, MD on 08/15/2020   Lloyd Huger, MD   01/15/2021 6:44 AM

## 2021-01-13 ENCOUNTER — Inpatient Hospital Stay: Payer: BC Managed Care – PPO | Attending: Oncology

## 2021-01-13 ENCOUNTER — Inpatient Hospital Stay (HOSPITAL_BASED_OUTPATIENT_CLINIC_OR_DEPARTMENT_OTHER): Payer: BC Managed Care – PPO | Admitting: Oncology

## 2021-01-13 ENCOUNTER — Inpatient Hospital Stay: Payer: BC Managed Care – PPO

## 2021-01-13 VITALS — BP 128/83 | HR 71 | Temp 97.6°F | Resp 18 | Wt 276.0 lb

## 2021-01-13 DIAGNOSIS — T451X5A Adverse effect of antineoplastic and immunosuppressive drugs, initial encounter: Secondary | ICD-10-CM | POA: Diagnosis not present

## 2021-01-13 DIAGNOSIS — Z8042 Family history of malignant neoplasm of prostate: Secondary | ICD-10-CM | POA: Diagnosis not present

## 2021-01-13 DIAGNOSIS — C787 Secondary malignant neoplasm of liver and intrahepatic bile duct: Secondary | ICD-10-CM | POA: Insufficient documentation

## 2021-01-13 DIAGNOSIS — Z79899 Other long term (current) drug therapy: Secondary | ICD-10-CM | POA: Insufficient documentation

## 2021-01-13 DIAGNOSIS — C189 Malignant neoplasm of colon, unspecified: Secondary | ICD-10-CM | POA: Diagnosis not present

## 2021-01-13 DIAGNOSIS — G620 Drug-induced polyneuropathy: Secondary | ICD-10-CM | POA: Insufficient documentation

## 2021-01-13 DIAGNOSIS — Z806 Family history of leukemia: Secondary | ICD-10-CM | POA: Diagnosis not present

## 2021-01-13 DIAGNOSIS — Z5111 Encounter for antineoplastic chemotherapy: Secondary | ICD-10-CM | POA: Diagnosis not present

## 2021-01-13 DIAGNOSIS — Z8051 Family history of malignant neoplasm of kidney: Secondary | ICD-10-CM | POA: Diagnosis not present

## 2021-01-13 DIAGNOSIS — Z803 Family history of malignant neoplasm of breast: Secondary | ICD-10-CM | POA: Diagnosis not present

## 2021-01-13 LAB — COMPREHENSIVE METABOLIC PANEL
ALT: 17 U/L (ref 0–44)
AST: 27 U/L (ref 15–41)
Albumin: 3.5 g/dL (ref 3.5–5.0)
Alkaline Phosphatase: 92 U/L (ref 38–126)
Anion gap: 5 (ref 5–15)
BUN: 15 mg/dL (ref 6–20)
CO2: 24 mmol/L (ref 22–32)
Calcium: 10.8 mg/dL — ABNORMAL HIGH (ref 8.9–10.3)
Chloride: 105 mmol/L (ref 98–111)
Creatinine, Ser: 0.68 mg/dL (ref 0.44–1.00)
GFR, Estimated: >60 mL/min (ref >60)
Glucose, Bld: 122 mg/dL — ABNORMAL HIGH (ref 70–99)
Potassium: 3.6 mmol/L (ref 3.5–5.1)
Sodium: 134 mmol/L — ABNORMAL LOW (ref 135–145)
Total Bilirubin: 0.5 mg/dL (ref 0.3–1.2)
Total Protein: 7.2 g/dL (ref 6.5–8.1)

## 2021-01-13 LAB — CBC WITH DIFFERENTIAL/PLATELET
Abs Immature Granulocytes: 0.07 10*3/uL (ref 0.00–0.07)
Basophils Absolute: 0.1 10*3/uL (ref 0.0–0.1)
Basophils Relative: 1 %
Eosinophils Absolute: 0.3 10*3/uL (ref 0.0–0.5)
Eosinophils Relative: 4 %
HCT: 39.5 % (ref 36.0–46.0)
Hemoglobin: 13.3 g/dL (ref 12.0–15.0)
Immature Granulocytes: 1 %
Lymphocytes Relative: 23 %
Lymphs Abs: 1.6 10*3/uL (ref 0.7–4.0)
MCH: 34.6 pg — ABNORMAL HIGH (ref 26.0–34.0)
MCHC: 33.7 g/dL (ref 30.0–36.0)
MCV: 102.9 fL — ABNORMAL HIGH (ref 80.0–100.0)
Monocytes Absolute: 0.8 10*3/uL (ref 0.1–1.0)
Monocytes Relative: 12 %
Neutro Abs: 4.1 10*3/uL (ref 1.7–7.7)
Neutrophils Relative %: 59 %
Platelets: 145 10*3/uL — ABNORMAL LOW (ref 150–400)
RBC: 3.84 MIL/uL — ABNORMAL LOW (ref 3.87–5.11)
RDW: 15.5 % (ref 11.5–15.5)
WBC: 7 10*3/uL (ref 4.0–10.5)
nRBC: 0 % (ref 0.0–0.2)

## 2021-01-13 MED ORDER — SODIUM CHLORIDE 0.9 % IV SOLN
180.0000 mg/m2 | Freq: Once | INTRAVENOUS | Status: AC
  Administered 2021-01-13: 440 mg via INTRAVENOUS
  Filled 2021-01-13: qty 5

## 2021-01-13 MED ORDER — FLUOROURACIL CHEMO INJECTION 2.5 GM/50ML
400.0000 mg/m2 | Freq: Once | INTRAVENOUS | Status: AC
  Administered 2021-01-13: 1000 mg via INTRAVENOUS
  Filled 2021-01-13: qty 20

## 2021-01-13 MED ORDER — SODIUM CHLORIDE 0.9 % IV SOLN
Freq: Once | INTRAVENOUS | Status: AC
  Filled 2021-01-13: qty 250

## 2021-01-13 MED ORDER — ATROPINE SULFATE 1 MG/ML IJ SOLN
0.5000 mg | Freq: Once | INTRAMUSCULAR | Status: AC | PRN
  Administered 2021-01-13: 0.5 mg via INTRAVENOUS
  Filled 2021-01-13: qty 1

## 2021-01-13 MED ORDER — PALONOSETRON HCL INJECTION 0.25 MG/5ML
0.2500 mg | Freq: Once | INTRAVENOUS | Status: AC
  Administered 2021-01-13: 0.25 mg via INTRAVENOUS
  Filled 2021-01-13: qty 5

## 2021-01-13 MED ORDER — DEXAMETHASONE SODIUM PHOSPHATE 100 MG/10ML IJ SOLN
10.0000 mg | Freq: Once | INTRAMUSCULAR | Status: AC
  Administered 2021-01-13: 10 mg via INTRAVENOUS
  Filled 2021-01-13: qty 10

## 2021-01-13 MED ORDER — SODIUM CHLORIDE 0.9% FLUSH
10.0000 mL | Freq: Once | INTRAVENOUS | Status: AC
  Administered 2021-01-13: 10 mL via INTRAVENOUS
  Filled 2021-01-13: qty 10

## 2021-01-13 MED ORDER — SODIUM CHLORIDE 0.9 % IV SOLN
2400.0000 mg/m2 | INTRAVENOUS | Status: DC
  Administered 2021-01-13: 5900 mg via INTRAVENOUS
  Filled 2021-01-13: qty 118

## 2021-01-13 MED ORDER — SODIUM CHLORIDE 0.9 % IV SOLN
408.0000 mg/m2 | Freq: Once | INTRAVENOUS | Status: AC
  Administered 2021-01-13: 1000 mg via INTRAVENOUS
  Filled 2021-01-13: qty 50

## 2021-01-13 NOTE — Patient Instructions (Signed)
[facility] ONCOLOGY  Discharge Instructions: Thank you for choosing [facility] to provide your oncology and hematology care.  If you have a lab appointment with the [facility], please go directly to the [facility] and check in at the registration area.  Wear comfortable clothing and clothing appropriate for easy access to any Portacath or PICC line.   We strive to give you quality time with your provider. You may need to reschedule your appointment if you arrive late (15 or more minutes).  Arriving late affects you and other patients whose appointments are after yours.  Also, if you miss three or more appointments without notifying the office, you may be dismissed from the clinic at the provider's discretion.      For prescription refill requests, have your pharmacy contact our office and allow 72 hours for refills to be completed.    Today you received the following chemotherapy and/or immunotherapy agents : Folfiri     To help prevent nausea and vomiting after your treatment, we encourage you to take your nausea medication as directed.  BELOW ARE SYMPTOMS THAT SHOULD BE REPORTED IMMEDIATELY: *FEVER GREATER THAN 100.4 F (38 C) OR HIGHER *CHILLS OR SWEATING *NAUSEA AND VOMITING THAT IS NOT CONTROLLED WITH YOUR NAUSEA MEDICATION *UNUSUAL SHORTNESS OF BREATH *UNUSUAL BRUISING OR BLEEDING *URINARY PROBLEMS (pain or burning when urinating, or frequent urination) *BOWEL PROBLEMS (unusual diarrhea, constipation, pain near the anus) TENDERNESS IN MOUTH AND THROAT WITH OR WITHOUT PRESENCE OF ULCERS (sore throat, sores in mouth, or a toothache) UNUSUAL RASH, SWELLING OR PAIN  UNUSUAL VAGINAL DISCHARGE OR ITCHING   Items with * indicate a potential emergency and should be followed up as soon as possible or go to the Emergency Department if any problems should occur.  Please show the CHEMOTHERAPY ALERT CARD or IMMUNOTHERAPY ALERT CARD at check-in to  the Emergency Department and triage nurse.  Should you have questions after your visit or need to cancel or reschedule your appointment, please contact [facility]  [phone] and follow the prompts.  Office hours are 8:00 a.m. to 4:30 p.m. Monday - Friday. Please note that voicemails left after 4:00 p.m. may not be returned until the following business day.  We are closed weekends and major holidays. You have access to a nurse at all times for urgent questions. Please call the main number to the clinic [phone] and follow the prompts.  For any non-urgent questions, you may also contact your provider using MyChart. We now offer e-Visits for anyone 37 and older to request care online for non-urgent symptoms. For details visit [email or web address].   Also download the MyChart app! Go to the app store, search "MyChart", open the app, select [facility], and log in with your MyChart username and password.  Due to Covid, a mask is required upon entering the hospital/clinic. If you do not have a mask, one will be given to you upon arrival. For doctor visits, patients may have 1 support person aged 47 or older with them. For treatment visits, patients cannot have anyone with them due to current Covid guidelines and our immunocompromised population.

## 2021-01-13 NOTE — Progress Notes (Signed)
Patient states that her hands and feet are still bothering her but her energy level has improved. She also reports constipation.

## 2021-01-15 ENCOUNTER — Inpatient Hospital Stay: Payer: BC Managed Care – PPO

## 2021-01-15 ENCOUNTER — Encounter: Payer: Self-pay | Admitting: Internal Medicine

## 2021-01-15 ENCOUNTER — Inpatient Hospital Stay (HOSPITAL_BASED_OUTPATIENT_CLINIC_OR_DEPARTMENT_OTHER): Payer: BC Managed Care – PPO | Admitting: Internal Medicine

## 2021-01-15 ENCOUNTER — Encounter: Payer: Self-pay | Admitting: Oncology

## 2021-01-15 DIAGNOSIS — T451X5A Adverse effect of antineoplastic and immunosuppressive drugs, initial encounter: Secondary | ICD-10-CM | POA: Diagnosis not present

## 2021-01-15 DIAGNOSIS — Z5111 Encounter for antineoplastic chemotherapy: Secondary | ICD-10-CM | POA: Diagnosis not present

## 2021-01-15 DIAGNOSIS — G620 Drug-induced polyneuropathy: Secondary | ICD-10-CM

## 2021-01-15 DIAGNOSIS — C189 Malignant neoplasm of colon, unspecified: Secondary | ICD-10-CM

## 2021-01-15 MED ORDER — HEPARIN SOD (PORK) LOCK FLUSH 100 UNIT/ML IV SOLN
500.0000 [IU] | Freq: Once | INTRAVENOUS | Status: AC | PRN
  Administered 2021-01-15: 500 [IU]
  Filled 2021-01-15: qty 5

## 2021-01-15 MED ORDER — SODIUM CHLORIDE 0.9% FLUSH
10.0000 mL | INTRAVENOUS | Status: DC | PRN
  Administered 2021-01-15: 10 mL
  Filled 2021-01-15: qty 10

## 2021-01-15 NOTE — Progress Notes (Signed)
[facility] [place], [place] [address] [phone]   New Patient Evaluation  Date of Service: 01/15/21 Patient Name: Devon Kingdon Patient MRN: [id number] Patient DOB: 30-Jan-1971 Provider: Ventura Sellers, MD  Identifying Statement:  Analayah Brooke is a 50 y.o. 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    Referring Provider: Kirk Ruths, MD [place] [facility] [place],  [place] [address]  Primary Cancer:  Oncologic History: Oncology History  Colon cancer ([place])  08/15/2020 Initial Diagnosis   Colon cancer ([place])   08/15/2020 Cancer Staging   Staging form: Colon and Rectum, AJCC 8th Edition - Clinical stage from 08/15/2020: Stage IVA (cTX, cNX, pM1a) - Signed by Lloyd Huger, MD on 08/15/2020   09/08/2020 - 12/17/2020 Chemotherapy           Genetic Testing   Negative genetic testing. No pathogenic variants identified on the [facility] CancerNext-Expanded+RNA panel. The report date is 09/24/2020.  The CancerNext-Expanded + RNAinsight gene panel offered by [employer] and includes sequencing and rearrangement analysis for the following 77 genes: IP, ALK, APC*, ATM*, AXIN2, BAP1, BARD1, BLM, BMPR1A, BRCA1*, BRCA2*, BRIP1*, CDC73, CDH1*,CDK4, CDKN1B, CDKN2A, CHEK2*, CTNNA1, DICER1, FANCC, FH, FLCN, GALNT12, KIF1B, LZTR1, MAX, MEN1, MET, MLH1*, MSH2*, MSH3, MSH6*, MUTYH*, NBN, NF1*, NF2, NTHL1, PALB2*, PHOX2B, PMS2*, POT1, PRKAR1A, PTCH1, PTEN*, RAD51C*, RAD51D*,RB1, RECQL, RET, SDHA, SDHAF2, SDHB, SDHC, SDHD, SMAD4, SMARCA4, SMARCB1, SMARCE1, STK11, SUFU, TMEM127, TP53*,TSC1, TSC2, VHL and XRCC2 (sequencing and deletion/duplication); EGFR, EGLN1, HOXB13, KIT, MITF, PDGFRA, POLD1 and POLE (sequencing only); EPCAM and GREM1 (deletion/duplication only).   01/13/2021 -  Chemotherapy    Patient is on  Treatment Plan: COLORECTAL FOLFIRI Q14D         History of Present Illness: The patient's records from the referring physician were obtained and reviewed and the patient interviewed to confirm this HPI.  Landree Fernholz presents today to review neuropathic symptoms.  She describes several months history of tingling, pins and needles affecting the bottom of her feet and her fingertips.  Denies frank pain or numbness.  Timing of symptoms is concurrent with dosing of chemotherapy.  No issues with gait, does describe occasionally "dropping objects out of hands".  Has undergone 8 cycles of FOLFOX and recently converted to FOLFIRI therapy with Dr. Grayland Ormond.  No diabetes or alcohol use.  Medications: Current Outpatient Medications on File Prior to Visit  Medication Sig Dispense Refill   ALPRAZolam (XANAX) 0.25 MG tablet Take 1 tablet (0.25 mg total) by mouth at bedtime as needed for anxiety. 30 tablet 0   cetirizine (ZYRTEC) 10 MG tablet Take 1 tablet by mouth daily.     cholecalciferol (VITAMIN D3) 25 MCG (1000 UNIT) tablet Take 1,000 Units by mouth daily.     docusate sodium (COLACE) 100 MG capsule Take 100 mg by mouth 2 (two) times daily.     lidocaine-prilocaine (EMLA) cream Apply 1 application topically as needed. 30 g 3   Multiple Vitamin (MULTIVITAMIN) tablet Take 1 tablet by mouth daily.     Omega-3 Fatty Acids (FISH OIL) 1000 MG CAPS Take 1 capsule by mouth daily.     polyethylene glycol (MIRALAX / GLYCOLAX) 17 g packet Take 17 g by mouth daily.     [DISCONTINUED] prochlorperazine (COMPAZINE) 10 MG tablet Take 1 tablet (10 mg total) by mouth every 6 (six) hours as needed (Nausea or vomiting). [place]  tablet 1   No current facility-administered medications on file prior to visit.    Allergies: No Known Allergies Past Medical History:  Past Medical History:  Diagnosis Date   Colon cancer ([place]) 08/15/2020   Family history of kidney cancer    Family history of leukemia    Family history of  prostate cancer    Past Surgical History: History reviewed. No pertinent surgical history. Social History:  Social History   Socioeconomic History   Marital status: Married    Spouse name: Not on file   Number of children: Not on file   Years of education: Not on file   Highest education level: Not on file  Occupational History   Not on file  Tobacco Use   Smoking status: Never   Smokeless tobacco: Never  Substance and Sexual Activity   Alcohol use: Never   Drug use: Never   Sexual activity: Yes    Partners: Male  Other Topics Concern   Not on file  Social History Narrative   Not on file   Social Determinants of Health   Financial Resource Strain: Not on file  Food Insecurity: Not on file  Transportation Needs: Not on file  Physical Activity: Not on file  Stress: Not on file  Social Connections: Not on file  Intimate Partner Violence: Not on file   Family History:  Family History  Problem Relation Age of Onset   Breast cancer Cousin        pat cousin   Prostate cancer Mother 20   Kidney cancer Maternal Uncle 43   Leukemia Maternal Grandfather 48    Review of Systems: Constitutional: Doesn't report fevers, chills or abnormal weight loss Eyes: Doesn't report blurriness of vision Ears, nose, mouth, throat, and face: Doesn't report sore throat Respiratory: Doesn't report cough, dyspnea or wheezes Cardiovascular: Doesn't report palpitation, chest discomfort  Gastrointestinal:  Doesn't report nausea, constipation, diarrhea GU: Doesn't report incontinence Skin: Doesn't report skin rashes Neurological: Per HPI Musculoskeletal: Doesn't report joint pain Behavioral/Psych: Doesn't report anxiety  Physical Exam: Vitals:   01/15/21 1110  BP: (!) 142/90  Pulse: 74  Resp: 18  Temp: 98.6 F (37 C)   KPS: 90. General: Alert, cooperative, pleasant, in no acute distress Head: Normal EENT: No conjunctival injection or scleral icterus.  Lungs: Resp effort  normal Cardiac: Regular rate Abdomen: Non-distended abdomen Skin: No rashes cyanosis or petechiae. Extremities: No clubbing or edema  Neurologic Exam: Mental Status: Awake, alert, attentive to examiner. Oriented to self and environment. Language is fluent with intact comprehension.  Cranial Nerves: Visual acuity is grossly normal. Visual fields are full. Extra-ocular movements intact. No ptosis. Face is symmetric Motor: Tone and bulk are normal. Power is full in both arms and legs. Reflexes are symmetric, no pathologic reflexes present.  Sensory: Intact to light touch Gait: Normal.   Labs: I have reviewed the data as listed    Component Value Date/Time   NA 134 (L) 01/13/2021 1020   K 3.6 01/13/2021 1020   CL 105 01/13/2021 1020   CO2 24 01/13/2021 1020   GLUCOSE 122 (H) 01/13/2021 1020   BUN 15 01/13/2021 1020   CREATININE 0.68 01/13/2021 1020   CALCIUM 10.8 (H) 01/13/2021 1020   PROT 7.2 01/13/2021 1020   ALBUMIN 3.5 01/13/2021 1020   AST 27 01/13/2021 1020   ALT 17 01/13/2021 1020   ALKPHOS 92 01/13/2021 1020   BILITOT 0.5 01/13/2021 1020   GFRNONAA >60 01/13/2021 1020   Lab  Results  Component Value Date   WBC 7.0 01/13/2021   NEUTROABS 4.1 01/13/2021   HGB 13.3 01/13/2021   HCT 39.5 01/13/2021   MCV 102.9 (H) 01/13/2021   PLT 145 (L) 01/13/2021     Assessment/Plan Chemotherapy-induced neuropathy (HCC)  [name] presents with clinical syndrome consistent with symmetric, length dependent, small and large fiber peripheral neuropathy.  Etiology is exposure to chemotherapy, oxaliplatin.  Fortunately, symptoms are minimally intrusive into activities of daily living.  Oxaliplatin has been discontinued.  We reviewed pathophysiology of chemotherapy induced neuropathy, available treatments, and goals of care.  We offered treatment with Gabapentin $RemoveBeforeD'300mg'[id number]$  BID; she will think it over and decide if she wants to start an additional therapy.    We spent twenty additional  minutes teaching regarding the natural history, biology, and historical experience in the treatment of neurologic complications of cancer.   We appreciate the opportunity to participate in the care of [name].  We are happy to follow up with her as needed.  All questions were answered. The patient knows to call the clinic with any problems, questions or concerns. No barriers to learning were detected.  The total time spent in the encounter was 40 minutes and more than 50% was on counseling and review of test results   Ventura Sellers, MD Medical Director of Neuro-Oncology [facility] 01/15/21 11:14 AM

## 2021-01-15 NOTE — Progress Notes (Signed)
New patient evaluation with Dr. Vaslow. 

## 2021-01-25 NOTE — Progress Notes (Signed)
[name]  [phone] [phone]  ID: Sherri Howell OB: 1970-09-19  MR#: [id number]  [id number]  Patient Care Team: Kirk Ruths, MD as PCP - General (Internal Medicine)    CHIEF COMPLAINT: Colon cancer metastasized to liver.  INTERVAL HISTORY: Patient returns to clinic today for further evaluation and consideration of cycle 10 of FOLFIRI.  She continues to have a peripheral neuropathy that is chronic and unchanged.  She otherwise feels well.  She denies any pain.  She has no other neurologic complaints.  She denies any recent fevers or illnesses.  She has a fair appetite and denies weight loss.  She has no chest pain, shortness of breath, cough, or hemoptysis.  She denies any nausea, vomiting, constipation, or diarrhea.  She has no further melena or hematochezia.  She has no urinary complaints.  Patient offers no further specific complaints today.  REVIEW OF SYSTEMS:   Review of Systems  Constitutional: Negative.  Negative for fever, malaise/fatigue and weight loss.  Respiratory: Negative.  Negative for cough, hemoptysis and shortness of breath.   Cardiovascular: Negative.  Negative for chest pain and leg swelling.  Gastrointestinal: Negative.  Negative for abdominal pain, blood in stool and melena.  Genitourinary: Negative.  Negative for dysuria.  Musculoskeletal: Negative.  Negative for back pain.  Skin: Negative.  Negative for rash.  Neurological:  Positive for tingling and sensory change. Negative for dizziness, seizures, weakness and headaches.  Psychiatric/Behavioral: Negative.  The patient is not nervous/anxious.    As per HPI. Otherwise, a complete review of systems is negative.  PAST MEDICAL HISTORY: Past Medical History:  Diagnosis Date   Colon cancer ([place]) 08/15/2020   Family history of kidney cancer    Family history of leukemia    Family history of prostate cancer     PAST SURGICAL HISTORY: No past surgical  history on file.  FAMILY HISTORY: Family History  Problem Relation Age of Onset   Breast cancer Cousin        pat cousin   Prostate cancer Mother 20   Kidney cancer Maternal Uncle 61   Leukemia Maternal Grandfather 86    ADVANCED DIRECTIVES (Y/N):  N  HEALTH MAINTENANCE: Social History   Tobacco Use   Smoking status: Never   Smokeless tobacco: Never  Substance Use Topics   Alcohol use: Never   Drug use: Never     Colonoscopy:  PAP:  Bone density:  Lipid panel:  No Known Allergies  Current Outpatient Medications  Medication Sig Dispense Refill   ALPRAZolam (XANAX) 0.25 MG tablet Take 1 tablet (0.25 mg total) by mouth at bedtime as needed for anxiety. 30 tablet 0   cetirizine (ZYRTEC) 10 MG tablet Take 1 tablet by mouth daily.     cholecalciferol (VITAMIN D3) 25 MCG (1000 UNIT) tablet Take 1,000 Units by mouth daily.     docusate sodium (COLACE) 100 MG capsule Take 100 mg by mouth 2 (two) times daily.     lidocaine-prilocaine (EMLA) cream Apply 1 application topically as needed. 30 g 3   Multiple Vitamin (MULTIVITAMIN) tablet Take 1 tablet by mouth daily.     Omega-3 Fatty Acids (FISH OIL) 1000 MG CAPS Take 1 capsule by mouth daily.     polyethylene glycol (MIRALAX / GLYCOLAX) 17 g packet Take 17 g by mouth daily.     No current facility-administered medications for this visit.    OBJECTIVE: Vitals:   01/27/21 0900  BP: 131/86  Pulse: 71  Resp:  16  Temp: 98.1 F (36.7 C)  SpO2: 98%     Body mass index is 40.55 kg/m.    ECOG FS:0 - Asymptomatic  General: Well-developed, well-nourished, no acute distress. Eyes: Pink conjunctiva, anicteric sclera. HEENT: Normocephalic, moist mucous membranes. Lungs: No audible wheezing or coughing. Heart: Regular rate and rhythm. Abdomen: Soft, nontender, no obvious distention. Musculoskeletal: No edema, cyanosis, or clubbing. Neuro: Alert, answering all questions appropriately. Cranial nerves grossly intact. Skin: No  rashes or petechiae noted. Psych: Normal affect.   LAB RESULTS:  Lab Results  Component Value Date   NA 137 01/27/2021   K 3.6 01/27/2021   CL 106 01/27/2021   CO2 26 01/27/2021   GLUCOSE 122 (H) 01/27/2021   BUN 13 01/27/2021   CREATININE 0.86 01/27/2021   CALCIUM 10.9 (H) 01/27/2021   PROT 7.0 01/27/2021   ALBUMIN 3.4 (L) 01/27/2021   AST 34 01/27/2021   ALT 22 01/27/2021   ALKPHOS 79 01/27/2021   BILITOT 0.6 01/27/2021   GFRNONAA >60 01/27/2021    Lab Results  Component Value Date   WBC 4.0 01/27/2021   NEUTROABS 1.7 01/27/2021   HGB 12.6 01/27/2021   HCT 37.7 01/27/2021   MCV 103.9 (H) 01/27/2021   PLT 179 01/27/2021     STUDIES: No results found.  ASSESSMENT: Colon cancer metastasized to liver.  PLAN:    1. Colon cancer metastasized to liver: Patient initially presented [facility] on on July 28, 2020 and underwent surgical intervention for metastatic colon cancer to the liver.  She was taken to the operating room by Cameron Sprang MD and Binnie Rail MD who performed a robot assisted laparoscopy, robot-assisted partial hepatectomy (Segment 4A/8), microwave ablation (x2, segments 7, 7/8), intraoperative ultrasound and robotic low anterior resection. Port has been placed.  Repeat colonoscopy at [facility] in June 2022 was reported as negative.  FOLFOX was discontinued given patient's persistent peripheral neuropathy and will complete cycles 9 through 12 with FOLFIRI.  Proceed with cycle 10 of treatment today.  Return to clinic in 2 days for pump removal and then in 2 weeks for further evaluation and consideration of cycle 11.    2.  Genetics referral: Negative. 3.  Polycythemia: Resolved. 4.  Hypercalcemia: Chronic and unchanged.  Patient's calcium is 10.9 today.   5.  Neutropenia: Resolved.   Consider adding Neulasta or bio similar in the future. 6.  Thrombocytopenia: Resolved. 7.  Anxiety: Continue 0.25 alprazolam as needed. 8.   Peripheral neuropathy: Switch to FOLFIRI as above.  Patient was also given a referral to neuro oncology.  She has not yet initiated gabapentin as prescribed.  Patient expressed understanding and was in agreement with this plan. She also understands that She can call clinic at any time with any questions, concerns, or complaints.   Cancer Staging Colon cancer [facility]) Staging form: Colon and Rectum, AJCC 8th Edition - Clinical stage from 08/15/2020: Stage IVA (cTX, cNX, pM1a) - Signed by Lloyd Huger, MD on 08/15/2020   Lloyd Huger, MD   01/29/2021 7:06 AM

## 2021-01-27 ENCOUNTER — Inpatient Hospital Stay: Payer: BC Managed Care – PPO

## 2021-01-27 ENCOUNTER — Inpatient Hospital Stay (HOSPITAL_BASED_OUTPATIENT_CLINIC_OR_DEPARTMENT_OTHER): Payer: BC Managed Care – PPO | Admitting: Oncology

## 2021-01-27 VITALS — BP 131/86 | HR 71 | Temp 98.1°F | Resp 16 | Wt 274.6 lb

## 2021-01-27 DIAGNOSIS — Z5111 Encounter for antineoplastic chemotherapy: Secondary | ICD-10-CM | POA: Diagnosis not present

## 2021-01-27 DIAGNOSIS — C189 Malignant neoplasm of colon, unspecified: Secondary | ICD-10-CM

## 2021-01-27 DIAGNOSIS — Z95828 Presence of other vascular implants and grafts: Secondary | ICD-10-CM

## 2021-01-27 LAB — CBC WITH DIFFERENTIAL/PLATELET
Abs Immature Granulocytes: 0 10*3/uL (ref 0.00–0.07)
Basophils Absolute: 0 10*3/uL (ref 0.0–0.1)
Basophils Relative: 0 %
Eosinophils Absolute: 0.3 10*3/uL (ref 0.0–0.5)
Eosinophils Relative: 8 %
HCT: 37.7 % (ref 36.0–46.0)
Hemoglobin: 12.6 g/dL (ref 12.0–15.0)
Immature Granulocytes: 0 %
Lymphocytes Relative: 39 %
Lymphs Abs: 1.5 10*3/uL (ref 0.7–4.0)
MCH: 34.7 pg — ABNORMAL HIGH (ref 26.0–34.0)
MCHC: 33.4 g/dL (ref 30.0–36.0)
MCV: 103.9 fL — ABNORMAL HIGH (ref 80.0–100.0)
Monocytes Absolute: 0.4 10*3/uL (ref 0.1–1.0)
Monocytes Relative: 9 %
Neutro Abs: 1.7 10*3/uL (ref 1.7–7.7)
Neutrophils Relative %: 44 %
Platelets: 179 10*3/uL (ref 150–400)
RBC: 3.63 MIL/uL — ABNORMAL LOW (ref 3.87–5.11)
RDW: 14 % (ref 11.5–15.5)
WBC: 4 10*3/uL (ref 4.0–10.5)
nRBC: 0 % (ref 0.0–0.2)

## 2021-01-27 LAB — COMPREHENSIVE METABOLIC PANEL
ALT: 22 U/L (ref 0–44)
AST: 34 U/L (ref 15–41)
Albumin: 3.4 g/dL — ABNORMAL LOW (ref 3.5–5.0)
Alkaline Phosphatase: 79 U/L (ref 38–126)
Anion gap: 5 (ref 5–15)
BUN: 13 mg/dL (ref 6–20)
CO2: 26 mmol/L (ref 22–32)
Calcium: 10.9 mg/dL — ABNORMAL HIGH (ref 8.9–10.3)
Chloride: 106 mmol/L (ref 98–111)
Creatinine, Ser: 0.86 mg/dL (ref 0.44–1.00)
GFR, Estimated: >60 mL/min (ref >60)
Glucose, Bld: 122 mg/dL — ABNORMAL HIGH (ref 70–99)
Potassium: 3.6 mmol/L (ref 3.5–5.1)
Sodium: 137 mmol/L (ref 135–145)
Total Bilirubin: 0.6 mg/dL (ref 0.3–1.2)
Total Protein: 7 g/dL (ref 6.5–8.1)

## 2021-01-27 MED ORDER — SODIUM CHLORIDE 0.9% FLUSH
10.0000 mL | INTRAVENOUS | Status: DC | PRN
  Filled 2021-01-27: qty 10

## 2021-01-27 MED ORDER — FLUOROURACIL CHEMO INJECTION 2.5 GM/50ML
400.0000 mg/m2 | Freq: Once | INTRAVENOUS | Status: AC
  Administered 2021-01-27: 1000 mg via INTRAVENOUS
  Filled 2021-01-27: qty 20

## 2021-01-27 MED ORDER — SODIUM CHLORIDE 0.9% FLUSH
10.0000 mL | Freq: Once | INTRAVENOUS | Status: AC
Start: 2021-01-27 — End: 2021-01-27
  Administered 2021-01-27: 10 mL via INTRAVENOUS
  Filled 2021-01-27: qty 10

## 2021-01-27 MED ORDER — SODIUM CHLORIDE 0.9 % IV SOLN
1000.0000 mg | Freq: Once | INTRAVENOUS | Status: AC
  Administered 2021-01-27: 1000 mg via INTRAVENOUS
  Filled 2021-01-27: qty 50

## 2021-01-27 MED ORDER — SODIUM CHLORIDE 0.9 % IV SOLN
2400.0000 mg/m2 | INTRAVENOUS | Status: DC
  Administered 2021-01-27: 5900 mg via INTRAVENOUS
  Filled 2021-01-27: qty 118

## 2021-01-27 MED ORDER — SODIUM CHLORIDE 0.9 % IV SOLN
10.0000 mg | Freq: Once | INTRAVENOUS | Status: AC
  Administered 2021-01-27: 10 mg via INTRAVENOUS
  Filled 2021-01-27: qty 10

## 2021-01-27 MED ORDER — HEPARIN SOD (PORK) LOCK FLUSH 100 UNIT/ML IV SOLN
500.0000 [IU] | Freq: Once | INTRAVENOUS | Status: DC
  Filled 2021-01-27: qty 5

## 2021-01-27 MED ORDER — SODIUM CHLORIDE 0.9 % IV SOLN
Freq: Once | INTRAVENOUS | Status: AC
  Filled 2021-01-27: qty 250

## 2021-01-27 MED ORDER — SODIUM CHLORIDE 0.9 % IV SOLN
180.0000 mg/m2 | Freq: Once | INTRAVENOUS | Status: AC
  Administered 2021-01-27: 440 mg via INTRAVENOUS
  Filled 2021-01-27: qty 15

## 2021-01-27 MED ORDER — PALONOSETRON HCL INJECTION 0.25 MG/5ML
0.2500 mg | Freq: Once | INTRAVENOUS | Status: AC
  Administered 2021-01-27: 0.25 mg via INTRAVENOUS
  Filled 2021-01-27: qty 5

## 2021-01-27 NOTE — Progress Notes (Signed)
Pt c/o constipation possibly related to recent chemo med change. Reports continued neuropathy in hands.

## 2021-01-27 NOTE — Patient Instructions (Signed)
[facility] ONCOLOGY  Discharge Instructions: Thank you for choosing [facility] to provide your oncology and hematology care.  If you have a lab appointment with the [facility], please go directly to the [facility] and check in at the registration area.  Wear comfortable clothing and clothing appropriate for easy access to any Portacath or PICC line.   We strive to give you quality time with your provider. You may need to reschedule your appointment if you arrive late (15 or more minutes).  Arriving late affects you and other patients whose appointments are after yours.  Also, if you miss three or more appointments without notifying the office, you may be dismissed from the clinic at the provider's discretion.      For prescription refill requests, have your pharmacy contact our office and allow 72 hours for refills to be completed.    Today you received the following chemotherapy and/or immunotherapy agents - irinotecan, fluorouracil      To help prevent nausea and vomiting after your treatment, we encourage you to take your nausea medication as directed.  BELOW ARE SYMPTOMS THAT SHOULD BE REPORTED IMMEDIATELY: *FEVER GREATER THAN 100.4 F (38 C) OR HIGHER *CHILLS OR SWEATING *NAUSEA AND VOMITING THAT IS NOT CONTROLLED WITH YOUR NAUSEA MEDICATION *UNUSUAL SHORTNESS OF BREATH *UNUSUAL BRUISING OR BLEEDING *URINARY PROBLEMS (pain or burning when urinating, or frequent urination) *BOWEL PROBLEMS (unusual diarrhea, constipation, pain near the anus) TENDERNESS IN MOUTH AND THROAT WITH OR WITHOUT PRESENCE OF ULCERS (sore throat, sores in mouth, or a toothache) UNUSUAL RASH, SWELLING OR PAIN  UNUSUAL VAGINAL DISCHARGE OR ITCHING   Items with * indicate a potential emergency and should be followed up as soon as possible or go to the Emergency Department if any problems should occur.  Please show the CHEMOTHERAPY ALERT CARD or IMMUNOTHERAPY ALERT  CARD at check-in to the Emergency Department and triage nurse.  Should you have questions after your visit or need to cancel or reschedule your appointment, please contact [facility]  [phone] and follow the prompts.  Office hours are 8:00 a.m. to 4:30 p.m. Monday - Friday. Please note that voicemails left after 4:00 p.m. may not be returned until the following business day.  We are closed weekends and major holidays. You have access to a nurse at all times for urgent questions. Please call the main number to the clinic [phone] and follow the prompts.  For any non-urgent questions, you may also contact your provider using MyChart. We now offer e-Visits for anyone 52 and older to request care online for non-urgent symptoms. For details visit [email or web address].   Also download the MyChart app! Go to the app store, search "MyChart", open the app, select [facility], and log in with your MyChart username and password.  Due to Covid, a mask is required upon entering the hospital/clinic. If you do not have a mask, one will be given to you upon arrival. For doctor visits, patients may have 1 support person aged 20 or older with them. For treatment visits, patients cannot have anyone with them due to current Covid guidelines and our immunocompromised population.    Irinotecan injection What is this medication? IRINOTECAN (ir in oh TEE kan ) is a chemotherapy drug. It is used to treat colon and rectal cancer. This medicine may be used for other purposes; ask your health care provider or pharmacist if you have questions. COMMON BRAND NAME(S): Camptosar What should I  tell my care team before I take this medication? They need to know if you have any of these conditions: dehydration diarrhea infection (especially a virus infection such as chickenpox, cold sores, or herpes) liver disease low blood counts, like low white cell, platelet, or red cell  counts low levels of calcium, magnesium, or potassium in the blood recent or ongoing radiation therapy an unusual or allergic reaction to irinotecan, other medicines, foods, dyes, or preservatives pregnant or trying to get pregnant breast-feeding How should I use this medication? This drug is given as an infusion into a vein. It is administered in a hospital or clinic by a specially trained health care professional. Talk to your pediatrician regarding the use of this medicine in children. Special care may be needed. Overdosage: If you think you have taken too much of this medicine contact a poison control center or emergency room at once. NOTE: This medicine is only for you. Do not share this medicine with others. What if I miss a dose? It is important not to miss your dose. Call your doctor or health care professional if you are unable to keep an appointment. What may interact with this medication? Do not take this medicine with any of the following medications: cobicistat itraconazole This medicine may interact with the following medications: antiviral medicines for HIV or AIDS certain antibiotics like rifampin or rifabutin certain medicines for fungal infections like ketoconazole, posaconazole, and voriconazole certain medicines for seizures like carbamazepine, phenobarbital, phenotoin clarithromycin gemfibrozil nefazodone [facility]. John's Wort This list may not describe all possible interactions. Give your health care provider a list of all the medicines, herbs, non-prescription drugs, or dietary supplements you use. Also tell them if you smoke, drink alcohol, or use illegal drugs. Some items may interact with your medicine. What should I watch for while using this medication? Your condition will be monitored carefully while you are receiving this medicine. You will need important blood work done while you are taking this medicine. This drug may make you feel generally unwell. This is not  uncommon, as chemotherapy can affect healthy cells as well as cancer cells. Report any side effects. Continue your course of treatment even though you feel ill unless your doctor tells you to stop. In some cases, you may be given additional medicines to help with side effects. Follow all directions for their use. You may get drowsy or dizzy. Do not drive, use machinery, or do anything that needs mental alertness until you know how this medicine affects you. Do not stand or sit up quickly, especially if you are an older patient. This reduces the risk of dizzy or fainting spells. Call your health care professional for advice if you get a fever, chills, or sore throat, or other symptoms of a cold or flu. Do not treat yourself. This medicine decreases your body's ability to fight infections. Try to avoid being around people who are sick. Avoid taking products that contain aspirin, acetaminophen, ibuprofen, naproxen, or ketoprofen unless instructed by your doctor. These medicines may hide a fever. This medicine may increase your risk to bruise or bleed. Call your doctor or health care professional if you notice any unusual bleeding. Be careful brushing and flossing your teeth or using a toothpick because you may get an infection or bleed more easily. If you have any dental work done, tell your dentist you are receiving this medicine. Do not become pregnant while taking this medicine or for 6 months after stopping it. Women should inform their  health care professional if they wish to become pregnant or think they might be pregnant. Men should not father a child while taking this medicine and for 3 months after stopping it. There is potential for serious side effects to an unborn child. Talk to your health care professional for more information. Do not breast-feed an infant while taking this medicine or for 7 days after stopping it. This medicine has caused ovarian failure in some women. This medicine may make it  more difficult to get pregnant. Talk to your health care professional if you are concerned about your fertility. This medicine has caused decreased sperm counts in some men. This may make it more difficult to father a child. Talk to your health care professional if you are concerned about your fertility. What side effects may I notice from receiving this medication? Side effects that you should report to your doctor or health care professional as soon as possible: allergic reactions like skin rash, itching or hives, swelling of the face, lips, or tongue chest pain diarrhea flushing, runny nose, sweating during infusion low blood counts - this medicine may decrease the number of white blood cells, red blood cells and platelets. You may be at increased risk for infections and bleeding. nausea, vomiting pain, swelling, warmth in the leg signs of decreased platelets or bleeding - bruising, pinpoint red spots on the skin, black, tarry stools, blood in the urine signs of infection - fever or chills, cough, sore throat, pain or difficulty passing urine signs of decreased red blood cells - unusually weak or tired, fainting spells, lightheadedness Side effects that usually do not require medical attention (report to your doctor or health care professional if they continue or are bothersome): constipation hair loss headache loss of appetite mouth sores stomach pain This list may not describe all possible side effects. Call your doctor for medical advice about side effects. You may report side effects to FDA at 1-800-FDA-1088. Where should I keep my medication? This drug is given in a hospital or clinic and will not be stored at home. NOTE: This sheet is a summary. It may not cover all possible information. If you have questions about this medicine, talk to your doctor, pharmacist, or health care provider.  2022 Elsevier/Gold Standard (2019-03-26 17:46:13)  Fluorouracil, 5-FU injection What is this  medication? FLUOROURACIL, 5-FU (flure oh YOOR a sil) is a chemotherapy drug. It slows the growth of cancer cells. This medicine is used to treat many types of cancer like breast cancer, colon or rectal cancer, pancreatic cancer, and stomach cancer. This medicine may be used for other purposes; ask your health care provider or pharmacist if you have questions. COMMON BRAND NAME(S): Adrucil What should I tell my care team before I take this medication? They need to know if you have any of these conditions: blood disorders dihydropyrimidine dehydrogenase (DPD) deficiency infection (especially a virus infection such as chickenpox, cold sores, or herpes) kidney disease liver disease malnourished, poor nutrition recent or ongoing radiation therapy an unusual or allergic reaction to fluorouracil, other chemotherapy, other medicines, foods, dyes, or preservatives pregnant or trying to get pregnant breast-feeding How should I use this medication? This drug is given as an infusion or injection into a vein. It is administered in a hospital or clinic by a specially trained health care professional. Talk to your pediatrician regarding the use of this medicine in children. Special care may be needed. Overdosage: If you think you have taken too much of this medicine contact  a poison control center or emergency room at once. NOTE: This medicine is only for you. Do not share this medicine with others. What if I miss a dose? It is important not to miss your dose. Call your doctor or health care professional if you are unable to keep an appointment. What may interact with this medication? Do not take this medicine with any of the following medications: live virus vaccines This medicine may also interact with the following medications: medicines that treat or prevent blood clots like warfarin, enoxaparin, and dalteparin This list may not describe all possible interactions. Give your health care provider a  list of all the medicines, herbs, non-prescription drugs, or dietary supplements you use. Also tell them if you smoke, drink alcohol, or use illegal drugs. Some items may interact with your medicine. What should I watch for while using this medication? Visit your doctor for checks on your progress. This drug may make you feel generally unwell. This is not uncommon, as chemotherapy can affect healthy cells as well as cancer cells. Report any side effects. Continue your course of treatment even though you feel ill unless your doctor tells you to stop. In some cases, you may be given additional medicines to help with side effects. Follow all directions for their use. Call your doctor or health care professional for advice if you get a fever, chills or sore throat, or other symptoms of a cold or flu. Do not treat yourself. This drug decreases your body's ability to fight infections. Try to avoid being around people who are sick. This medicine may increase your risk to bruise or bleed. Call your doctor or health care professional if you notice any unusual bleeding. Be careful brushing and flossing your teeth or using a toothpick because you may get an infection or bleed more easily. If you have any dental work done, tell your dentist you are receiving this medicine. Avoid taking products that contain aspirin, acetaminophen, ibuprofen, naproxen, or ketoprofen unless instructed by your doctor. These medicines may hide a fever. Do not become pregnant while taking this medicine. Women should inform their doctor if they wish to become pregnant or think they might be pregnant. There is a potential for serious side effects to an unborn child. Talk to your health care professional or pharmacist for more information. Do not breast-feed an infant while taking this medicine. Men should inform their doctor if they wish to father a child. This medicine may lower sperm counts. Do not treat diarrhea with over the counter  products. Contact your doctor if you have diarrhea that lasts more than 2 days or if it is severe and watery. This medicine can make you more sensitive to the sun. Keep out of the sun. If you cannot avoid being in the sun, wear protective clothing and use sunscreen. Do not use sun lamps or tanning beds/booths. What side effects may I notice from receiving this medication? Side effects that you should report to your doctor or health care professional as soon as possible: allergic reactions like skin rash, itching or hives, swelling of the face, lips, or tongue low blood counts - this medicine may decrease the number of white blood cells, red blood cells and platelets. You may be at increased risk for infections and bleeding. signs of infection - fever or chills, cough, sore throat, pain or difficulty passing urine signs of decreased platelets or bleeding - bruising, pinpoint red spots on the skin, black, tarry stools, blood in the urine signs  of decreased red blood cells - unusually weak or tired, fainting spells, lightheadedness breathing problems changes in vision chest pain mouth sores nausea and vomiting pain, swelling, redness at site where injected pain, tingling, numbness in the hands or feet redness, swelling, or sores on hands or feet stomach pain unusual bleeding Side effects that usually do not require medical attention (report to your doctor or health care professional if they continue or are bothersome): changes in finger or toe nails diarrhea dry or itchy skin hair loss headache loss of appetite sensitivity of eyes to the light stomach upset unusually teary eyes This list may not describe all possible side effects. Call your doctor for medical advice about side effects. You may report side effects to FDA at 1-800-FDA-1088. Where should I keep my medication? This drug is given in a hospital or clinic and will not be stored at home. NOTE: This sheet is a summary. It may not  cover all possible information. If you have questions about this medicine, talk to your doctor, pharmacist, or health care provider.  2022 Elsevier/Gold Standard (2019-03-26 15:00:03)

## 2021-01-29 ENCOUNTER — Inpatient Hospital Stay: Payer: BC Managed Care – PPO

## 2021-01-29 ENCOUNTER — Encounter: Payer: Self-pay | Admitting: Oncology

## 2021-01-29 DIAGNOSIS — C189 Malignant neoplasm of colon, unspecified: Secondary | ICD-10-CM

## 2021-01-29 DIAGNOSIS — Z5111 Encounter for antineoplastic chemotherapy: Secondary | ICD-10-CM | POA: Diagnosis not present

## 2021-01-29 MED ORDER — HEPARIN SOD (PORK) LOCK FLUSH 100 UNIT/ML IV SOLN
INTRAVENOUS | Status: AC
  Filled 2021-01-29: qty 5

## 2021-01-29 MED ORDER — SODIUM CHLORIDE 0.9% FLUSH
10.0000 mL | INTRAVENOUS | Status: DC | PRN
  Administered 2021-01-29: 10 mL
  Filled 2021-01-29: qty 10

## 2021-01-29 MED ORDER — HEPARIN SOD (PORK) LOCK FLUSH 100 UNIT/ML IV SOLN
500.0000 [IU] | Freq: Once | INTRAVENOUS | Status: AC | PRN
  Administered 2021-01-29: 500 [IU]
  Filled 2021-01-29: qty 5

## 2021-02-08 NOTE — Progress Notes (Signed)
[place]  [phone] [phone]) [phone]  ID: Sherri Howell OB: 04/14/1971  MR#: [id number]  MEQ#:[id number]  Patient Care Team: Kirk Ruths, MD as PCP - General (Internal Medicine)    CHIEF COMPLAINT: Colon cancer metastasized to liver.  INTERVAL HISTORY: Patient returns to clinic today for further evaluation and consideration of cycle 11 of FOLFIRI.  She has noticed increased and prolonged menstrual bleeding this past week and thinks she might be perimenopausal.  She continues to have a peripheral neuropathy that is chronic and unchanged.  She denies any pain.  She has no other neurologic complaints.  She denies any recent fevers or illnesses.  She has a fair appetite and denies weight loss.  She has no chest pain, shortness of breath, cough, or hemoptysis.  She denies any nausea, vomiting, constipation, or diarrhea.  She has no further melena or hematochezia.  She has no urinary complaints.  Patient offers no further specific complaints today.  REVIEW OF SYSTEMS:   Review of Systems  Constitutional: Negative.  Negative for fever, malaise/fatigue and weight loss.  Respiratory: Negative.  Negative for cough, hemoptysis and shortness of breath.   Cardiovascular: Negative.  Negative for chest pain and leg swelling.  Gastrointestinal: Negative.  Negative for abdominal pain, blood in stool and melena.  Genitourinary: Negative.  Negative for dysuria.  Musculoskeletal: Negative.  Negative for back pain.  Skin: Negative.  Negative for rash.  Neurological:  Positive for tingling and sensory change. Negative for dizziness, seizures, weakness and headaches.  Psychiatric/Behavioral: Negative.  The patient is not nervous/anxious.    As per HPI. Otherwise, a complete review of systems is negative.  PAST MEDICAL HISTORY: Past Medical History:  Diagnosis Date   Colon cancer ([place]) 08/15/2020   Family history of kidney cancer    Family history of leukemia     Family history of prostate cancer     PAST SURGICAL HISTORY: History reviewed. No pertinent surgical history.  FAMILY HISTORY: Family History  Problem Relation Age of Onset   Breast cancer Cousin        pat cousin   Prostate cancer Mother 88   Kidney cancer Maternal Uncle 34   Leukemia Maternal Grandfather 62    ADVANCED DIRECTIVES (Y/N):  N  HEALTH MAINTENANCE: Social History   Tobacco Use   Smoking status: Never   Smokeless tobacco: Never  Substance Use Topics   Alcohol use: Never   Drug use: Never     Colonoscopy:  PAP:  Bone density:  Lipid panel:  No Known Allergies  Current Outpatient Medications  Medication Sig Dispense Refill   ALPRAZolam (XANAX) 0.25 MG tablet Take 1 tablet (0.25 mg total) by mouth at bedtime as needed for anxiety. 30 tablet 0   cetirizine (ZYRTEC) 10 MG tablet Take 1 tablet by mouth daily.     cholecalciferol (VITAMIN D3) 25 MCG (1000 UNIT) tablet Take 1,000 Units by mouth daily.     docusate sodium (COLACE) 100 MG capsule Take 100 mg by mouth 2 (two) times daily.     lidocaine-prilocaine (EMLA) cream Apply 1 application topically as needed. 30 g 3   Multiple Vitamin (MULTIVITAMIN) tablet Take 1 tablet by mouth daily.     Omega-3 Fatty Acids (FISH OIL) 1000 MG CAPS Take 1 capsule by mouth daily.     polyethylene glycol (MIRALAX / GLYCOLAX) 17 g packet Take 17 g by mouth daily.     No current facility-administered medications for this visit.   Facility-Administered Medications  Ordered in Other Visits  Medication Dose Route Frequency Provider Last Rate Last Admin   dexamethasone (DECADRON) 10 mg in sodium chloride 0.9 % 50 mL IVPB  10 mg Intravenous Once Lloyd Huger, MD       fluorouracil (ADRUCIL) 5,900 mg in sodium chloride 0.9 % 132 mL chemo infusion  2,400 mg/m2 (Treatment Plan Recorded) Intravenous 1 day or 1 dose Lloyd Huger, MD   5,900 mg at 02/10/21 1245   prochlorperazine (COMPAZINE) tablet 10 mg  10 mg Oral Q6H  PRN Lloyd Huger, MD   10 mg at 02/10/21 1250    OBJECTIVE: Vitals:   02/10/21 0912  BP: (!) 142/98  Pulse: 69  Resp: 18  Temp: 98.3 F (36.8 C)     Body mass index is 40.65 kg/m.    ECOG FS:0 - Asymptomatic  General: Well-developed, well-nourished, no acute distress. Eyes: Pink conjunctiva, anicteric sclera. HEENT: Normocephalic, moist mucous membranes. Lungs: No audible wheezing or coughing. Heart: Regular rate and rhythm. Abdomen: Soft, nontender, no obvious distention. Musculoskeletal: No edema, cyanosis, or clubbing. Neuro: Alert, answering all questions appropriately. Cranial nerves grossly intact. Skin: No rashes or petechiae noted. Psych: Normal affect.  LAB RESULTS:  Lab Results  Component Value Date   NA 135 02/10/2021   K 3.3 (L) 02/10/2021   CL 103 02/10/2021   CO2 25 02/10/2021   GLUCOSE 122 (H) 02/10/2021   BUN 14 02/10/2021   CREATININE 0.87 02/10/2021   CALCIUM 10.7 (H) 02/10/2021   PROT 6.9 02/10/2021   ALBUMIN 3.6 02/10/2021   AST 27 02/10/2021   ALT 18 02/10/2021   ALKPHOS 76 02/10/2021   BILITOT 0.7 02/10/2021   GFRNONAA >60 02/10/2021    Lab Results  Component Value Date   WBC 3.5 (L) 02/10/2021   NEUTROABS 1.3 (L) 02/10/2021   HGB 12.5 02/10/2021   HCT 36.6 02/10/2021   MCV 103.4 (H) 02/10/2021   PLT 174 02/10/2021     STUDIES: No results found.  ASSESSMENT: Colon cancer metastasized to liver.  PLAN:    1. Colon cancer metastasized to liver: Patient initially presented to [facility] on on July 28, 2020 and underwent surgical intervention for metastatic colon cancer to the liver.  She was taken to the operating room by Cameron Sprang MD and Binnie Rail MD who performed a robot assisted laparoscopy, robot-assisted partial hepatectomy (Segment 4A/8), microwave ablation (x2, segments 7, 7/8), intraoperative ultrasound and robotic low anterior resection. Port has been placed.  Repeat colonoscopy at [facility] in June 2022 was reported as negative.  FOLFOX was discontinued given patient's persistent peripheral neuropathy and will complete cycles 9 through 12 with FOLFIRI.  Proceed with cycle 11 of treatment today.  Return to clinic in 2 days for pump removal and then in 2 weeks for further evaluation and her 12th and final infusion of treatment. 2.  Genetics referral: Negative. 3.  Heavy menstrual bleeding: Patient's hemoglobin remains within normal limits.  It is possible that she is entering menopause.  Monitor and if continues to be problematic, consider a referral to OB/GYN. 4.  Hypercalcemia: Chronic and unchanged.  Patient's calcium is 10.7 today.   5.  Neutropenia: Resolved.   Patient's ANC is 1.3 today. 6.  Thrombocytopenia: Resolved. 7.  Anxiety: Continue 0.25 alprazolam as needed. 8.  Peripheral neuropathy: Switch to FOLFIRI as above.  Patient was previously given a referral to neuro oncology.  She has not yet initiated gabapentin as prescribed.  Patient  expressed understanding and was in agreement with this plan. She also understands that She can call clinic at any time with any questions, concerns, or complaints.   Cancer Staging Colon cancer [facility]) Staging form: Colon and Rectum, AJCC 8th Edition - Clinical stage from 08/15/2020: Stage IVA (cTX, cNX, pM1a) - Signed by Lloyd Huger, MD on 08/15/2020   Lloyd Huger, MD   02/10/2021 2:14 PM

## 2021-02-10 ENCOUNTER — Inpatient Hospital Stay: Payer: BC Managed Care – PPO | Attending: Oncology

## 2021-02-10 ENCOUNTER — Inpatient Hospital Stay: Payer: BC Managed Care – PPO

## 2021-02-10 ENCOUNTER — Inpatient Hospital Stay (HOSPITAL_BASED_OUTPATIENT_CLINIC_OR_DEPARTMENT_OTHER): Payer: BC Managed Care – PPO | Admitting: Oncology

## 2021-02-10 ENCOUNTER — Encounter: Payer: Self-pay | Admitting: Oncology

## 2021-02-10 VITALS — BP 142/98 | HR 69 | Temp 98.3°F | Resp 18 | Wt 275.3 lb

## 2021-02-10 VITALS — BP 140/88 | HR 60 | Resp 18

## 2021-02-10 DIAGNOSIS — F419 Anxiety disorder, unspecified: Secondary | ICD-10-CM | POA: Insufficient documentation

## 2021-02-10 DIAGNOSIS — Z5111 Encounter for antineoplastic chemotherapy: Secondary | ICD-10-CM | POA: Insufficient documentation

## 2021-02-10 DIAGNOSIS — C787 Secondary malignant neoplasm of liver and intrahepatic bile duct: Secondary | ICD-10-CM | POA: Insufficient documentation

## 2021-02-10 DIAGNOSIS — T451X5A Adverse effect of antineoplastic and immunosuppressive drugs, initial encounter: Secondary | ICD-10-CM | POA: Diagnosis not present

## 2021-02-10 DIAGNOSIS — G620 Drug-induced polyneuropathy: Secondary | ICD-10-CM | POA: Insufficient documentation

## 2021-02-10 DIAGNOSIS — C189 Malignant neoplasm of colon, unspecified: Secondary | ICD-10-CM

## 2021-02-10 DIAGNOSIS — Z79899 Other long term (current) drug therapy: Secondary | ICD-10-CM | POA: Insufficient documentation

## 2021-02-10 LAB — COMPREHENSIVE METABOLIC PANEL
ALT: 18 U/L (ref 0–44)
AST: 27 U/L (ref 15–41)
Albumin: 3.6 g/dL (ref 3.5–5.0)
Alkaline Phosphatase: 76 U/L (ref 38–126)
Anion gap: 7 (ref 5–15)
BUN: 14 mg/dL (ref 6–20)
CO2: 25 mmol/L (ref 22–32)
Calcium: 10.7 mg/dL — ABNORMAL HIGH (ref 8.9–10.3)
Chloride: 103 mmol/L (ref 98–111)
Creatinine, Ser: 0.87 mg/dL (ref 0.44–1.00)
GFR, Estimated: >60 mL/min (ref >60)
Glucose, Bld: 122 mg/dL — ABNORMAL HIGH (ref 70–99)
Potassium: 3.3 mmol/L — ABNORMAL LOW (ref 3.5–5.1)
Sodium: 135 mmol/L (ref 135–145)
Total Bilirubin: 0.7 mg/dL (ref 0.3–1.2)
Total Protein: 6.9 g/dL (ref 6.5–8.1)

## 2021-02-10 LAB — CBC WITH DIFFERENTIAL/PLATELET
Abs Immature Granulocytes: 0.01 10*3/uL (ref 0.00–0.07)
Basophils Absolute: 0 10*3/uL (ref 0.0–0.1)
Basophils Relative: 0 %
Eosinophils Absolute: 0.1 10*3/uL (ref 0.0–0.5)
Eosinophils Relative: 4 %
HCT: 36.6 % (ref 36.0–46.0)
Hemoglobin: 12.5 g/dL (ref 12.0–15.0)
Immature Granulocytes: 0 %
Lymphocytes Relative: 44 %
Lymphs Abs: 1.5 10*3/uL (ref 0.7–4.0)
MCH: 35.3 pg — ABNORMAL HIGH (ref 26.0–34.0)
MCHC: 34.2 g/dL (ref 30.0–36.0)
MCV: 103.4 fL — ABNORMAL HIGH (ref 80.0–100.0)
Monocytes Absolute: 0.5 10*3/uL (ref 0.1–1.0)
Monocytes Relative: 14 %
Neutro Abs: 1.3 10*3/uL — ABNORMAL LOW (ref 1.7–7.7)
Neutrophils Relative %: 38 %
Platelets: 174 10*3/uL (ref 150–400)
RBC: 3.54 MIL/uL — ABNORMAL LOW (ref 3.87–5.11)
RDW: 13.9 % (ref 11.5–15.5)
WBC: 3.5 10*3/uL — ABNORMAL LOW (ref 4.0–10.5)
nRBC: 0 % (ref 0.0–0.2)

## 2021-02-10 MED ORDER — FLUOROURACIL CHEMO INJECTION 2.5 GM/50ML
400.0000 mg/m2 | Freq: Once | INTRAVENOUS | Status: AC
  Administered 2021-02-10: 1000 mg via INTRAVENOUS
  Filled 2021-02-10: qty 20

## 2021-02-10 MED ORDER — SODIUM CHLORIDE 0.9 % IV SOLN
10.0000 mg | Freq: Once | INTRAVENOUS | Status: AC
  Administered 2021-02-10: 10 mg via INTRAVENOUS
  Filled 2021-02-10: qty 10

## 2021-02-10 MED ORDER — SODIUM CHLORIDE 0.9 % IV SOLN
10.0000 mg | Freq: Once | INTRAVENOUS | Status: DC
  Filled 2021-02-10: qty 1

## 2021-02-10 MED ORDER — SODIUM CHLORIDE 0.9 % IV SOLN
408.0000 mg/m2 | Freq: Once | INTRAVENOUS | Status: AC
  Administered 2021-02-10: 1000 mg via INTRAVENOUS
  Filled 2021-02-10: qty 50

## 2021-02-10 MED ORDER — PROCHLORPERAZINE MALEATE 10 MG PO TABS
10.0000 mg | ORAL_TABLET | Freq: Four times a day (QID) | ORAL | Status: DC | PRN
  Administered 2021-02-10: 10 mg via ORAL

## 2021-02-10 MED ORDER — PALONOSETRON HCL INJECTION 0.25 MG/5ML
0.2500 mg | Freq: Once | INTRAVENOUS | Status: AC
  Administered 2021-02-10: 0.25 mg via INTRAVENOUS
  Filled 2021-02-10: qty 5

## 2021-02-10 MED ORDER — FLUOROURACIL CHEMO INJECTION 5 GM/100ML
2400.0000 mg/m2 | INTRAVENOUS | Status: DC
  Administered 2021-02-10: 5900 mg via INTRAVENOUS
  Filled 2021-02-10: qty 118

## 2021-02-10 MED ORDER — SODIUM CHLORIDE 0.9 % IV SOLN
180.0000 mg/m2 | Freq: Once | INTRAVENOUS | Status: AC
  Administered 2021-02-10: 440 mg via INTRAVENOUS
  Filled 2021-02-10: qty 15

## 2021-02-10 MED ORDER — SODIUM CHLORIDE 0.9 % IV SOLN
Freq: Once | INTRAVENOUS | Status: AC
  Filled 2021-02-10: qty 250

## 2021-02-10 MED ORDER — SODIUM CHLORIDE 0.9% FLUSH
10.0000 mL | Freq: Once | INTRAVENOUS | Status: AC
  Administered 2021-02-10: 10 mL via INTRAVENOUS
  Filled 2021-02-10: qty 10

## 2021-02-10 NOTE — Progress Notes (Signed)
Abnormal menstrual cycle with heavy bleeding which is abnormal.  Neuropathy is stable.

## 2021-02-10 NOTE — Progress Notes (Signed)
Per Dr. Grayland Ormond, no atropine should be given prior to Irinotecan as a pre med since patient had issues with constipation after her first dose.

## 2021-02-12 ENCOUNTER — Inpatient Hospital Stay: Payer: BC Managed Care – PPO

## 2021-02-12 ENCOUNTER — Encounter: Payer: Self-pay | Admitting: Oncology

## 2021-02-12 DIAGNOSIS — C189 Malignant neoplasm of colon, unspecified: Secondary | ICD-10-CM | POA: Diagnosis not present

## 2021-02-12 MED ORDER — SODIUM CHLORIDE 0.9% FLUSH
10.0000 mL | INTRAVENOUS | Status: DC | PRN
  Administered 2021-02-12: 10 mL
  Filled 2021-02-12: qty 10

## 2021-02-12 MED ORDER — HEPARIN SOD (PORK) LOCK FLUSH 100 UNIT/ML IV SOLN
INTRAVENOUS | Status: AC
  Filled 2021-02-12: qty 5

## 2021-02-12 MED ORDER — HEPARIN SOD (PORK) LOCK FLUSH 100 UNIT/ML IV SOLN
500.0000 [IU] | Freq: Once | INTRAVENOUS | Status: AC | PRN
  Administered 2021-02-12: 500 [IU]
  Filled 2021-02-12: qty 5

## 2021-02-22 NOTE — Progress Notes (Signed)
[place]  Telephone:(336) [phone] [phone]) [phone]  ID: Sherri Howell OB: July 20, 1970  MR#: [id number]  TKZ#:[id number]  Patient Care Team: Kirk Ruths, MD as PCP - General (Internal Medicine)    CHIEF COMPLAINT: Colon cancer metastasized to liver.  INTERVAL HISTORY: Patient returns to clinic today for further evaluation and consideration of her 12th and final cycle of FOLFIRI.  Her menstrual bleeding has resolved.  She continues to have a peripheral neuropathy that is chronic and unchanged.  She denies any pain.  She has no other neurologic complaints.  She denies any recent fevers or illnesses.  She has a fair appetite and denies weight loss.  She has no chest pain, shortness of breath, cough, or hemoptysis.  She denies any nausea, vomiting, constipation, or diarrhea.  She has no further melena or hematochezia.  She has no urinary complaints.  Patient offers no further specific complaints today.  REVIEW OF SYSTEMS:   Review of Systems  Constitutional: Negative.  Negative for fever, malaise/fatigue and weight loss.  Respiratory: Negative.  Negative for cough, hemoptysis and shortness of breath.   Cardiovascular: Negative.  Negative for chest pain and leg swelling.  Gastrointestinal: Negative.  Negative for abdominal pain, blood in stool and melena.  Genitourinary: Negative.  Negative for dysuria.  Musculoskeletal: Negative.  Negative for back pain.  Skin: Negative.  Negative for rash.  Neurological:  Positive for tingling and sensory change. Negative for dizziness, seizures, weakness and headaches.  Psychiatric/Behavioral: Negative.  The patient is not nervous/anxious.    As per HPI. Otherwise, a complete review of systems is negative.  PAST MEDICAL HISTORY: Past Medical History:  Diagnosis Date   Colon cancer ([place]) 08/15/2020   Family history of kidney cancer    Family history of leukemia    Family history of prostate cancer     PAST SURGICAL  HISTORY: No past surgical history on file.  FAMILY HISTORY: Family History  Problem Relation Age of Onset   Breast cancer Cousin        pat cousin   Prostate cancer Mother 72   Kidney cancer Maternal Uncle 47   Leukemia Maternal Grandfather 79    ADVANCED DIRECTIVES (Y/N):  N  HEALTH MAINTENANCE: Social History   Tobacco Use   Smoking status: Never   Smokeless tobacco: Never  Substance Use Topics   Alcohol use: Never   Drug use: Never     Colonoscopy:  PAP:  Bone density:  Lipid panel:  No Known Allergies  Current Outpatient Medications  Medication Sig Dispense Refill   ALPRAZolam (XANAX) 0.25 MG tablet Take 1 tablet (0.25 mg total) by mouth at bedtime as needed for anxiety. 30 tablet 0   cetirizine (ZYRTEC) 10 MG tablet Take 1 tablet by mouth daily.     cholecalciferol (VITAMIN D3) 25 MCG (1000 UNIT) tablet Take 1,000 Units by mouth daily.     docusate sodium (COLACE) 100 MG capsule Take 100 mg by mouth 2 (two) times daily.     lidocaine-prilocaine (EMLA) cream Apply 1 application topically as needed. 30 g 3   Multiple Vitamin (MULTIVITAMIN) tablet Take 1 tablet by mouth daily.     Omega-3 Fatty Acids (FISH OIL) 1000 MG CAPS Take 1 capsule by mouth daily.     polyethylene glycol (MIRALAX / GLYCOLAX) 17 g packet Take 17 g by mouth daily.     No current facility-administered medications for this visit.    OBJECTIVE: Vitals:   02/24/21 0933  BP: (!) 141/91  Pulse: 85  Resp: 16  Temp: 98 F (36.7 C)  SpO2: 99%     Body mass index is 40.88 kg/m.    ECOG FS:0 - Asymptomatic  General: Well-developed, well-nourished, no acute distress. Eyes: Pink conjunctiva, anicteric sclera. HEENT: Normocephalic, moist mucous membranes. Lungs: No audible wheezing or coughing. Heart: Regular rate and rhythm. Abdomen: Soft, nontender, no obvious distention. Musculoskeletal: No edema, cyanosis, or clubbing. Neuro: Alert, answering all questions appropriately. Cranial nerves  grossly intact. Skin: No rashes or petechiae noted. Psych: Normal affect.   LAB RESULTS:  Lab Results  Component Value Date   NA 134 (L) 02/24/2021   K 3.6 02/24/2021   CL 103 02/24/2021   CO2 27 02/24/2021   GLUCOSE 120 (H) 02/24/2021   BUN 15 02/24/2021   CREATININE 1.04 (H) 02/24/2021   CALCIUM 11.0 (H) 02/24/2021   PROT 7.2 02/24/2021   ALBUMIN 3.8 02/24/2021   AST 31 02/24/2021   ALT 25 02/24/2021   ALKPHOS 74 02/24/2021   BILITOT 0.8 02/24/2021   GFRNONAA >60 02/24/2021    Lab Results  Component Value Date   WBC 3.9 (L) 02/24/2021   NEUTROABS 1.7 02/24/2021   HGB 12.6 02/24/2021   HCT 36.9 02/24/2021   MCV 103.1 (H) 02/24/2021   PLT 180 02/24/2021     STUDIES: No results found.  ASSESSMENT: Colon cancer metastasized to liver.  PLAN:    1. Colon cancer metastasized to liver: Patient initially presented to [facility] on on July 28, 2020 and underwent surgical intervention for metastatic colon cancer to the liver.  She was taken to the operating room by Cameron Sprang MD and Binnie Rail MD who performed a robot assisted laparoscopy, robot-assisted partial hepatectomy (Segment 4A/8), microwave ablation (x2, segments 7, 7/8), intraoperative ultrasound and robotic low anterior resection. Port has been placed.  Repeat colonoscopy at [facility] in June 2022 was reported as negative.  FOLFOX was discontinued given patient's persistent peripheral neuropathy and will complete cycles 9 through 12 with FOLFIRI.  Proceed with cycle 12 of treatment today.  Return to clinic in 2 days for pump removal and then in 3 months with repeat imaging and further evaluation.  2.  Genetics referral: Negative. 3.  Heavy menstrual bleeding: Resolved.  Patient believes she is perimenopausal.  Monitor and consider referral to OB/GYN.   4.  Hypercalcemia: Chronic and unchanged.  Patient's calcium is 11.0 today. 5.  Neutropenia: Resolved.   Patient's ANC is 1.7  today.. 6.  Thrombocytopenia: Resolved. 7.  Anxiety: Continue 0.25 alprazolam as needed. 8.  Peripheral neuropathy: Switch to FOLFIRI as above.  Patient was previously given a referral to neuro oncology.  She has not yet initiated gabapentin as prescribed.  Patient expressed understanding and was in agreement with this plan. She also understands that She can call clinic at any time with any questions, concerns, or complaints.   Cancer Staging Colon cancer [facility]) Staging form: Colon and Rectum, AJCC 8th Edition - Clinical stage from 08/15/2020: Stage IVA (cTX, cNX, pM1a) - Signed by Lloyd Huger, MD on 08/15/2020   Lloyd Huger, MD   02/25/2021 6:06 AM

## 2021-02-24 ENCOUNTER — Inpatient Hospital Stay: Payer: BC Managed Care – PPO

## 2021-02-24 ENCOUNTER — Other Ambulatory Visit: Payer: Self-pay

## 2021-02-24 ENCOUNTER — Inpatient Hospital Stay (HOSPITAL_BASED_OUTPATIENT_CLINIC_OR_DEPARTMENT_OTHER): Payer: BC Managed Care – PPO | Admitting: Oncology

## 2021-02-24 VITALS — BP 141/91 | HR 85 | Temp 98.0°F | Resp 16 | Wt 276.8 lb

## 2021-02-24 DIAGNOSIS — C189 Malignant neoplasm of colon, unspecified: Secondary | ICD-10-CM

## 2021-02-24 DIAGNOSIS — Z5111 Encounter for antineoplastic chemotherapy: Secondary | ICD-10-CM

## 2021-02-24 LAB — CBC WITH DIFFERENTIAL/PLATELET
Abs Immature Granulocytes: 0.01 10*3/uL (ref 0.00–0.07)
Basophils Absolute: 0 10*3/uL (ref 0.0–0.1)
Basophils Relative: 1 %
Eosinophils Absolute: 0.2 10*3/uL (ref 0.0–0.5)
Eosinophils Relative: 4 %
HCT: 36.9 % (ref 36.0–46.0)
Hemoglobin: 12.6 g/dL (ref 12.0–15.0)
Immature Granulocytes: 0 %
Lymphocytes Relative: 37 %
Lymphs Abs: 1.4 10*3/uL (ref 0.7–4.0)
MCH: 35.2 pg — ABNORMAL HIGH (ref 26.0–34.0)
MCHC: 34.1 g/dL (ref 30.0–36.0)
MCV: 103.1 fL — ABNORMAL HIGH (ref 80.0–100.0)
Monocytes Absolute: 0.6 10*3/uL (ref 0.1–1.0)
Monocytes Relative: 15 %
Neutro Abs: 1.7 10*3/uL (ref 1.7–7.7)
Neutrophils Relative %: 43 %
Platelets: 180 10*3/uL (ref 150–400)
RBC: 3.58 MIL/uL — ABNORMAL LOW (ref 3.87–5.11)
RDW: 13.9 % (ref 11.5–15.5)
WBC: 3.9 10*3/uL — ABNORMAL LOW (ref 4.0–10.5)
nRBC: 0 % (ref 0.0–0.2)

## 2021-02-24 LAB — COMPREHENSIVE METABOLIC PANEL
ALT: 25 U/L (ref 0–44)
AST: 31 U/L (ref 15–41)
Albumin: 3.8 g/dL (ref 3.5–5.0)
Alkaline Phosphatase: 74 U/L (ref 38–126)
Anion gap: 4 — ABNORMAL LOW (ref 5–15)
BUN: 15 mg/dL (ref 6–20)
CO2: 27 mmol/L (ref 22–32)
Calcium: 11 mg/dL — ABNORMAL HIGH (ref 8.9–10.3)
Chloride: 103 mmol/L (ref 98–111)
Creatinine, Ser: 1.04 mg/dL — ABNORMAL HIGH (ref 0.44–1.00)
GFR, Estimated: >60 mL/min (ref >60)
Glucose, Bld: 120 mg/dL — ABNORMAL HIGH (ref 70–99)
Potassium: 3.6 mmol/L (ref 3.5–5.1)
Sodium: 134 mmol/L — ABNORMAL LOW (ref 135–145)
Total Bilirubin: 0.8 mg/dL (ref 0.3–1.2)
Total Protein: 7.2 g/dL (ref 6.5–8.1)

## 2021-02-24 MED ORDER — HEPARIN SOD (PORK) LOCK FLUSH 100 UNIT/ML IV SOLN
500.0000 [IU] | Freq: Once | INTRAVENOUS | Status: DC
  Filled 2021-02-24: qty 5

## 2021-02-24 MED ORDER — SODIUM CHLORIDE 0.9 % IV SOLN
408.0000 mg/m2 | Freq: Once | INTRAVENOUS | Status: AC
  Administered 2021-02-24: 1000 mg via INTRAVENOUS
  Filled 2021-02-24: qty 50

## 2021-02-24 MED ORDER — SODIUM CHLORIDE 0.9% FLUSH
10.0000 mL | Freq: Once | INTRAVENOUS | Status: AC
  Administered 2021-02-24: 10 mL via INTRAVENOUS
  Filled 2021-02-24: qty 10

## 2021-02-24 MED ORDER — FLUOROURACIL CHEMO INJECTION 2.5 GM/50ML
400.0000 mg/m2 | Freq: Once | INTRAVENOUS | Status: AC
  Administered 2021-02-24: 1000 mg via INTRAVENOUS
  Filled 2021-02-24: qty 20

## 2021-02-24 MED ORDER — SODIUM CHLORIDE 0.9 % IV SOLN
10.0000 mg | Freq: Once | INTRAVENOUS | Status: AC
  Administered 2021-02-24: 10 mg via INTRAVENOUS
  Filled 2021-02-24: qty 10

## 2021-02-24 MED ORDER — SODIUM CHLORIDE 0.9 % IV SOLN
2400.0000 mg/m2 | INTRAVENOUS | Status: DC
  Administered 2021-02-24: 5900 mg via INTRAVENOUS
  Filled 2021-02-24: qty 118

## 2021-02-24 MED ORDER — SODIUM CHLORIDE 0.9 % IV SOLN
Freq: Once | INTRAVENOUS | Status: AC
  Filled 2021-02-24: qty 250

## 2021-02-24 MED ORDER — PALONOSETRON HCL INJECTION 0.25 MG/5ML
0.2500 mg | Freq: Once | INTRAVENOUS | Status: AC
  Administered 2021-02-24: 0.25 mg via INTRAVENOUS
  Filled 2021-02-24: qty 5

## 2021-02-24 MED ORDER — SODIUM CHLORIDE 0.9 % IV SOLN
180.0000 mg/m2 | Freq: Once | INTRAVENOUS | Status: AC
  Administered 2021-02-24: 440 mg via INTRAVENOUS
  Filled 2021-02-24: qty 15

## 2021-02-24 NOTE — Patient Instructions (Signed)
[facility]  Discharge Instructions: Thank you for choosing [facility] to provide your oncology and hematology care.  If you have a lab appointment with the [facility], please go directly to the [facility] and check in at the registration area.  Wear comfortable clothing and clothing appropriate for easy access to any Portacath or PICC line.   We strive to give you quality time with your provider. You may need to reschedule your appointment if you arrive late (15 or more minutes).  Arriving late affects you and other patients whose appointments are after yours.  Also, if you miss three or more appointments without notifying the office, you may be dismissed from the clinic at the provider's discretion.      For prescription refill requests, have your pharmacy contact our office and allow 72 hours for refills to be completed.    Today you received the following chemotherapy and/or immunotherapy agents IRINOTECAN, LEUCOVORIN, 5 FU      To help prevent nausea and vomiting after your treatment, we encourage you to take your nausea medication as directed.  BELOW ARE SYMPTOMS THAT SHOULD BE REPORTED IMMEDIATELY: *FEVER GREATER THAN 100.4 F (38 C) OR HIGHER *CHILLS OR SWEATING *NAUSEA AND VOMITING THAT IS NOT CONTROLLED WITH YOUR NAUSEA MEDICATION *UNUSUAL SHORTNESS OF BREATH *UNUSUAL BRUISING OR BLEEDING *URINARY PROBLEMS (pain or burning when urinating, or frequent urination) *BOWEL PROBLEMS (unusual diarrhea, constipation, pain near the anus) TENDERNESS IN MOUTH AND THROAT WITH OR WITHOUT PRESENCE OF ULCERS (sore throat, sores in mouth, or a toothache) UNUSUAL RASH, SWELLING OR PAIN  UNUSUAL VAGINAL DISCHARGE OR ITCHING   Items with * indicate a potential emergency and should be followed up as soon as possible or go to the Emergency Department if any problems should occur.  Please show the CHEMOTHERAPY ALERT CARD or IMMUNOTHERAPY ALERT  CARD at check-in to the Emergency Department and triage nurse.  Should you have questions after your visit or need to cancel or reschedule your appointment, please contact [facility]  [phone] and follow the prompts.  Office hours are 8:00 a.m. to 4:30 p.m. Monday - Friday. Please note that voicemails left after 4:00 p.m. may not be returned until the following business day.  We are closed weekends and major holidays. You have access to a nurse at all times for urgent questions. Please call the main number to the clinic [phone] and follow the prompts.  For any non-urgent questions, you may also contact your provider using MyChart. We now offer e-Visits for anyone 41 and older to request care online for non-urgent symptoms. For details visit [email or web address].   Also download the MyChart app! Go to the app store, search "MyChart", open the app, select [facility], and log in with your MyChart username and password.  Due to Covid, a mask is required upon entering the hospital/clinic. If you do not have a mask, one will be given to you upon arrival. For doctor visits, patients may have 1 support person aged 4 or older with them. For treatment visits, patients cannot have anyone with them due to current Covid guidelines and our immunocompromised population.   Irinotecan injection What is this medication? IRINOTECAN (ir in oh TEE kan ) is a chemotherapy drug. It is used to treat colon and rectal cancer. This medicine may be used for other purposes; ask your health care provider or pharmacist if you have questions. COMMON BRAND NAME(S): Camptosar What should I  tell my care team before I take this medication? They need to know if you have any of these conditions: dehydration diarrhea infection (especially a virus infection such as chickenpox, cold sores, or herpes) liver disease low blood counts, like low white cell, platelet, or red cell counts low  levels of calcium, magnesium, or potassium in the blood recent or ongoing radiation therapy an unusual or allergic reaction to irinotecan, other medicines, foods, dyes, or preservatives pregnant or trying to get pregnant breast-feeding How should I use this medication? This drug is given as an infusion into a vein. It is administered in a hospital or clinic by a specially trained health care professional. Talk to your pediatrician regarding the use of this medicine in children. Special care may be needed. Overdosage: If you think you have taken too much of this medicine contact a poison control center or emergency room at once. NOTE: This medicine is only for you. Do not share this medicine with others. What if I miss a dose? It is important not to miss your dose. Call your doctor or health care professional if you are unable to keep an appointment. What may interact with this medication? Do not take this medicine with any of the following medications: cobicistat itraconazole This medicine may interact with the following medications: antiviral medicines for HIV or AIDS certain antibiotics like rifampin or rifabutin certain medicines for fungal infections like ketoconazole, posaconazole, and voriconazole certain medicines for seizures like carbamazepine, phenobarbital, phenotoin clarithromycin gemfibrozil nefazodone [facility]'s Wort This list may not describe all possible interactions. Give your health care provider a list of all the medicines, herbs, non-prescription drugs, or dietary supplements you use. Also tell them if you smoke, drink alcohol, or use illegal drugs. Some items may interact with your medicine. What should I watch for while using this medication? Your condition will be monitored carefully while you are receiving this medicine. You will need important blood work done while you are taking this medicine. This drug may make you feel generally unwell. This is not uncommon, as  chemotherapy can affect healthy cells as well as cancer cells. Report any side effects. Continue your course of treatment even though you feel ill unless your doctor tells you to stop. In some cases, you may be given additional medicines to help with side effects. Follow all directions for their use. You may get drowsy or dizzy. Do not drive, use machinery, or do anything that needs mental alertness until you know how this medicine affects you. Do not stand or sit up quickly, especially if you are an older patient. This reduces the risk of dizzy or fainting spells. Call your health care professional for advice if you get a fever, chills, or sore throat, or other symptoms of a cold or flu. Do not treat yourself. This medicine decreases your body's ability to fight infections. Try to avoid being around people who are sick. Avoid taking products that contain aspirin, acetaminophen, ibuprofen, naproxen, or ketoprofen unless instructed by your doctor. These medicines may hide a fever. This medicine may increase your risk to bruise or bleed. Call your doctor or health care professional if you notice any unusual bleeding. Be careful brushing and flossing your teeth or using a toothpick because you may get an infection or bleed more easily. If you have any dental work done, tell your dentist you are receiving this medicine. Do not become pregnant while taking this medicine or for 6 months after stopping it. Women should inform their  health care professional if they wish to become pregnant or think they might be pregnant. Men should not father a child while taking this medicine and for 3 months after stopping it. There is potential for serious side effects to an unborn child. Talk to your health care professional for more information. Do not breast-feed an infant while taking this medicine or for 7 days after stopping it. This medicine has caused ovarian failure in some women. This medicine may make it more  difficult to get pregnant. Talk to your health care professional if you are concerned about your fertility. This medicine has caused decreased sperm counts in some men. This may make it more difficult to father a child. Talk to your health care professional if you are concerned about your fertility. What side effects may I notice from receiving this medication? Side effects that you should report to your doctor or health care professional as soon as possible: allergic reactions like skin rash, itching or hives, swelling of the face, lips, or tongue chest pain diarrhea flushing, runny nose, sweating during infusion low blood counts - this medicine may decrease the number of white blood cells, red blood cells and platelets. You may be at increased risk for infections and bleeding. nausea, vomiting pain, swelling, warmth in the leg signs of decreased platelets or bleeding - bruising, pinpoint red spots on the skin, black, tarry stools, blood in the urine signs of infection - fever or chills, cough, sore throat, pain or difficulty passing urine signs of decreased red blood cells - unusually weak or tired, fainting spells, lightheadedness Side effects that usually do not require medical attention (report to your doctor or health care professional if they continue or are bothersome): constipation hair loss headache loss of appetite mouth sores stomach pain This list may not describe all possible side effects. Call your doctor for medical advice about side effects. You may report side effects to FDA at 1-800-FDA-1088. Where should I keep my medication? This drug is given in a hospital or clinic and will not be stored at home. NOTE: This sheet is a summary. It may not cover all possible information. If you have questions about this medicine, talk to your doctor, pharmacist, or health care provider.  2022 Elsevier/Gold Standard (2019-03-26 17:46:13)  Leucovorin injection What is this  medication? LEUCOVORIN (loo koe VOR in) is used to prevent or treat the harmful effects of some medicines. This medicine is used to treat anemia caused by a low amount of folic acid in the body. It is also used with 5-fluorouracil (5-FU) to treat colon cancer. This medicine may be used for other purposes; ask your health care provider or pharmacist if you have questions. What should I tell my care team before I take this medication? They need to know if you have any of these conditions: anemia from low levels of vitamin B-12 in the blood an unusual or allergic reaction to leucovorin, folic acid, other medicines, foods, dyes, or preservatives pregnant or trying to get pregnant breast-feeding How should I use this medication? This medicine is for injection into a muscle or into a vein. It is given by a health care professional in a hospital or clinic setting. Talk to your pediatrician regarding the use of this medicine in children. Special care may be needed. Overdosage: If you think you have taken too much of this medicine contact a poison control center or emergency room at once. NOTE: This medicine is only for you. Do not share this  medicine with others. What if I miss a dose? This does not apply. What may interact with this medication? capecitabine fluorouracil phenobarbital phenytoin primidone trimethoprim-sulfamethoxazole This list may not describe all possible interactions. Give your health care provider a list of all the medicines, herbs, non-prescription drugs, or dietary supplements you use. Also tell them if you smoke, drink alcohol, or use illegal drugs. Some items may interact with your medicine. What should I watch for while using this medication? Your condition will be monitored carefully while you are receiving this medicine. This medicine may increase the side effects of 5-fluorouracil, 5-FU. Tell your doctor or health care professional if you have diarrhea or mouth sores  that do not get better or that get worse. What side effects may I notice from receiving this medication? Side effects that you should report to your doctor or health care professional as soon as possible: allergic reactions like skin rash, itching or hives, swelling of the face, lips, or tongue breathing problems fever, infection mouth sores unusual bleeding or bruising unusually weak or tired Side effects that usually do not require medical attention (report to your doctor or health care professional if they continue or are bothersome): constipation or diarrhea loss of appetite nausea, vomiting This list may not describe all possible side effects. Call your doctor for medical advice about side effects. You may report side effects to FDA at 1-800-FDA-1088. Where should I keep my medication? This drug is given in a hospital or clinic and will not be stored at home. NOTE: This sheet is a summary. It may not cover all possible information. If you have questions about this medicine, talk to your doctor, pharmacist, or health care provider.  2022 Elsevier/Gold Standard (2007-10-30 16:50:29)  Fluorouracil, 5-FU injection What is this medication? FLUOROURACIL, 5-FU (flure oh YOOR a sil) is a chemotherapy drug. It slows the growth of cancer cells. This medicine is used to treat many types of cancer like breast cancer, colon or rectal cancer, pancreatic cancer, and stomach cancer. This medicine may be used for other purposes; ask your health care provider or pharmacist if you have questions. COMMON BRAND NAME(S): Adrucil What should I tell my care team before I take this medication? They need to know if you have any of these conditions: blood disorders dihydropyrimidine dehydrogenase (DPD) deficiency infection (especially a virus infection such as chickenpox, cold sores, or herpes) kidney disease liver disease malnourished, poor nutrition recent or ongoing radiation therapy an unusual or  allergic reaction to fluorouracil, other chemotherapy, other medicines, foods, dyes, or preservatives pregnant or trying to get pregnant breast-feeding How should I use this medication? This drug is given as an infusion or injection into a vein. It is administered in a hospital or clinic by a specially trained health care professional. Talk to your pediatrician regarding the use of this medicine in children. Special care may be needed. Overdosage: If you think you have taken too much of this medicine contact a poison control center or emergency room at once. NOTE: This medicine is only for you. Do not share this medicine with others. What if I miss a dose? It is important not to miss your dose. Call your doctor or health care professional if you are unable to keep an appointment. What may interact with this medication? Do not take this medicine with any of the following medications: live virus vaccines This medicine may also interact with the following medications: medicines that treat or prevent blood clots like warfarin, enoxaparin, and dalteparin  This list may not describe all possible interactions. Give your health care provider a list of all the medicines, herbs, non-prescription drugs, or dietary supplements you use. Also tell them if you smoke, drink alcohol, or use illegal drugs. Some items may interact with your medicine. What should I watch for while using this medication? Visit your doctor for checks on your progress. This drug may make you feel generally unwell. This is not uncommon, as chemotherapy can affect healthy cells as well as cancer cells. Report any side effects. Continue your course of treatment even though you feel ill unless your doctor tells you to stop. In some cases, you may be given additional medicines to help with side effects. Follow all directions for their use. Call your doctor or health care professional for advice if you get a fever, chills or sore throat, or  other symptoms of a cold or flu. Do not treat yourself. This drug decreases your body's ability to fight infections. Try to avoid being around people who are sick. This medicine may increase your risk to bruise or bleed. Call your doctor or health care professional if you notice any unusual bleeding. Be careful brushing and flossing your teeth or using a toothpick because you may get an infection or bleed more easily. If you have any dental work done, tell your dentist you are receiving this medicine. Avoid taking products that contain aspirin, acetaminophen, ibuprofen, naproxen, or ketoprofen unless instructed by your doctor. These medicines may hide a fever. Do not become pregnant while taking this medicine. Women should inform their doctor if they wish to become pregnant or think they might be pregnant. There is a potential for serious side effects to an unborn child. Talk to your health care professional or pharmacist for more information. Do not breast-feed an infant while taking this medicine. Men should inform their doctor if they wish to father a child. This medicine may lower sperm counts. Do not treat diarrhea with over the counter products. Contact your doctor if you have diarrhea that lasts more than 2 days or if it is severe and watery. This medicine can make you more sensitive to the sun. Keep out of the sun. If you cannot avoid being in the sun, wear protective clothing and use sunscreen. Do not use sun lamps or tanning beds/booths. What side effects may I notice from receiving this medication? Side effects that you should report to your doctor or health care professional as soon as possible: allergic reactions like skin rash, itching or hives, swelling of the face, lips, or tongue low blood counts - this medicine may decrease the number of white blood cells, red blood cells and platelets. You may be at increased risk for infections and bleeding. signs of infection - fever or chills,  cough, sore throat, pain or difficulty passing urine signs of decreased platelets or bleeding - bruising, pinpoint red spots on the skin, black, tarry stools, blood in the urine signs of decreased red blood cells - unusually weak or tired, fainting spells, lightheadedness breathing problems changes in vision chest pain mouth sores nausea and vomiting pain, swelling, redness at site where injected pain, tingling, numbness in the hands or feet redness, swelling, or sores on hands or feet stomach pain unusual bleeding Side effects that usually do not require medical attention (report to your doctor or health care professional if they continue or are bothersome): changes in finger or toe nails diarrhea dry or itchy skin hair loss headache loss of appetite sensitivity  of eyes to the light stomach upset unusually teary eyes This list may not describe all possible side effects. Call your doctor for medical advice about side effects. You may report side effects to FDA at 1-800-FDA-1088. Where should I keep my medication? This drug is given in a hospital or clinic and will not be stored at home. NOTE: This sheet is a summary. It may not cover all possible information. If you have questions about this medicine, talk to your doctor, pharmacist, or health care provider.  2022 Elsevier/Gold Standard (2019-03-26 15:00:03)

## 2021-02-24 NOTE — Progress Notes (Signed)
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 No other complaints at this time

## 2021-02-25 ENCOUNTER — Encounter: Payer: Self-pay | Admitting: Oncology

## 2021-02-26 ENCOUNTER — Other Ambulatory Visit: Payer: Self-pay

## 2021-02-26 ENCOUNTER — Inpatient Hospital Stay: Payer: BC Managed Care – PPO

## 2021-02-26 DIAGNOSIS — C189 Malignant neoplasm of colon, unspecified: Secondary | ICD-10-CM

## 2021-02-26 MED ORDER — HEPARIN SOD (PORK) LOCK FLUSH 100 UNIT/ML IV SOLN
500.0000 [IU] | Freq: Once | INTRAVENOUS | Status: AC | PRN
  Administered 2021-02-26: 500 [IU]
  Filled 2021-02-26: qty 5

## 2021-02-26 MED ORDER — SODIUM CHLORIDE 0.9% FLUSH
10.0000 mL | INTRAVENOUS | Status: DC | PRN
  Administered 2021-02-26: 10 mL
  Filled 2021-02-26: qty 10

## 2021-02-26 MED ORDER — HEPARIN SOD (PORK) LOCK FLUSH 100 UNIT/ML IV SOLN
INTRAVENOUS | Status: AC
  Filled 2021-02-26: qty 5

## 2021-04-12 ENCOUNTER — Other Ambulatory Visit: Payer: Self-pay

## 2021-04-12 ENCOUNTER — Inpatient Hospital Stay: Payer: BC Managed Care – PPO | Attending: Oncology

## 2021-04-12 DIAGNOSIS — Z452 Encounter for adjustment and management of vascular access device: Secondary | ICD-10-CM | POA: Diagnosis present

## 2021-04-12 DIAGNOSIS — Z95828 Presence of other vascular implants and grafts: Secondary | ICD-10-CM

## 2021-04-12 DIAGNOSIS — Z85038 Personal history of other malignant neoplasm of large intestine: Secondary | ICD-10-CM | POA: Diagnosis present

## 2021-04-12 MED ORDER — SODIUM CHLORIDE 0.9% FLUSH
10.0000 mL | Freq: Once | INTRAVENOUS | Status: AC
  Administered 2021-04-12: 10 mL via INTRAVENOUS
  Filled 2021-04-12: qty 10

## 2021-04-12 MED ORDER — HEPARIN SOD (PORK) LOCK FLUSH 100 UNIT/ML IV SOLN
500.0000 [IU] | Freq: Once | INTRAVENOUS | Status: AC
  Administered 2021-04-12: 500 [IU] via INTRAVENOUS
  Filled 2021-04-12: qty 5

## 2021-04-19 ENCOUNTER — Encounter: Payer: Self-pay | Admitting: Oncology

## 2021-04-19 ENCOUNTER — Encounter: Payer: Self-pay | Admitting: Emergency Medicine

## 2021-05-25 ENCOUNTER — Other Ambulatory Visit: Payer: Self-pay

## 2021-05-25 ENCOUNTER — Ambulatory Visit
Admission: RE | Admit: 2021-05-25 | Discharge: 2021-05-25 | Disposition: A | Discharge status: Home / Self Care | Payer: BC Managed Care – PPO | Source: Ambulatory Visit | Attending: Oncology | Admitting: Oncology

## 2021-05-25 ENCOUNTER — Inpatient Hospital Stay: Payer: BC Managed Care – PPO | Attending: Oncology

## 2021-05-25 DIAGNOSIS — F419 Anxiety disorder, unspecified: Secondary | ICD-10-CM | POA: Insufficient documentation

## 2021-05-25 DIAGNOSIS — Z85038 Personal history of other malignant neoplasm of large intestine: Secondary | ICD-10-CM | POA: Insufficient documentation

## 2021-05-25 DIAGNOSIS — G629 Polyneuropathy, unspecified: Secondary | ICD-10-CM | POA: Diagnosis not present

## 2021-05-25 DIAGNOSIS — Z452 Encounter for adjustment and management of vascular access device: Secondary | ICD-10-CM | POA: Diagnosis present

## 2021-05-25 DIAGNOSIS — Z95828 Presence of other vascular implants and grafts: Secondary | ICD-10-CM

## 2021-05-25 DIAGNOSIS — C189 Malignant neoplasm of colon, unspecified: Secondary | ICD-10-CM

## 2021-05-25 LAB — CBC WITH DIFFERENTIAL/PLATELET
Abs Immature Granulocytes: 0.03 10*3/uL (ref 0.00–0.07)
Basophils Absolute: 0 10*3/uL (ref 0.0–0.1)
Basophils Relative: 0 %
Eosinophils Absolute: 0.5 10*3/uL (ref 0.0–0.5)
Eosinophils Relative: 8 %
HCT: 41.5 % (ref 36.0–46.0)
Hemoglobin: 14.2 g/dL (ref 12.0–15.0)
Immature Granulocytes: 1 %
Lymphocytes Relative: 27 %
Lymphs Abs: 1.7 10*3/uL (ref 0.7–4.0)
MCH: 33.1 pg (ref 26.0–34.0)
MCHC: 34.2 g/dL (ref 30.0–36.0)
MCV: 96.7 fL (ref 80.0–100.0)
Monocytes Absolute: 0.5 10*3/uL (ref 0.1–1.0)
Monocytes Relative: 9 %
Neutro Abs: 3.4 10*3/uL (ref 1.7–7.7)
Neutrophils Relative %: 55 %
Platelets: 233 10*3/uL (ref 150–400)
RBC: 4.29 MIL/uL (ref 3.87–5.11)
RDW: 12.3 % (ref 11.5–15.5)
WBC: 6.1 10*3/uL (ref 4.0–10.5)
nRBC: 0 % (ref 0.0–0.2)

## 2021-05-25 LAB — COMPREHENSIVE METABOLIC PANEL
ALT: 16 U/L (ref 0–44)
AST: 21 U/L (ref 15–41)
Albumin: 3.9 g/dL (ref 3.5–5.0)
Alkaline Phosphatase: 62 U/L (ref 38–126)
Anion gap: 6 (ref 5–15)
BUN: 17 mg/dL (ref 6–20)
CO2: 26 mmol/L (ref 22–32)
Calcium: 10.9 mg/dL — ABNORMAL HIGH (ref 8.9–10.3)
Chloride: 103 mmol/L (ref 98–111)
Creatinine, Ser: 0.78 mg/dL (ref 0.44–1.00)
GFR, Estimated: >60 mL/min (ref >60)
Glucose, Bld: 108 mg/dL — ABNORMAL HIGH (ref 70–99)
Potassium: 4 mmol/L (ref 3.5–5.1)
Sodium: 135 mmol/L (ref 135–145)
Total Bilirubin: 0.6 mg/dL (ref 0.3–1.2)
Total Protein: 7.2 g/dL (ref 6.5–8.1)

## 2021-05-25 MED ORDER — HEPARIN SOD (PORK) LOCK FLUSH 100 UNIT/ML IV SOLN
500.0000 [IU] | Freq: Once | INTRAVENOUS | Status: AC
Start: 2021-05-25 — End: 2021-05-25
  Administered 2021-05-25: 500 [IU] via INTRAVENOUS
  Filled 2021-05-25: qty 5

## 2021-05-25 MED ORDER — IOHEXOL 300 MG/ML  SOLN
100.0000 mL | Freq: Once | INTRAMUSCULAR | Status: AC | PRN
  Administered 2021-05-25: 100 mL via INTRAVENOUS

## 2021-05-25 MED ORDER — SODIUM CHLORIDE 0.9% FLUSH
10.0000 mL | INTRAVENOUS | Status: DC | PRN
  Administered 2021-05-25: 10 mL via INTRAVENOUS
  Filled 2021-05-25: qty 10

## 2021-05-27 NOTE — Progress Notes (Signed)
[place]  [phone] Fax:(336) [phone]  ID: Vastie Douty OB: 1970/11/16  MR#: [id number]  AOZ#:[id number]  Patient Care Team: Kirk Ruths, MD as PCP - General (Internal Medicine)    CHIEF COMPLAINT: Colon cancer metastasized to liver.  INTERVAL HISTORY: Patient returns to clinic today for further evaluation and discussion of her imaging results.  She continues to have a peripheral neuropathy that is chronic and unchanged.  She otherwise feels well.  She denies any pain.  She has no other neurologic complaints.  She denies any recent fevers or illnesses.  She has a fair appetite and denies weight loss.  She has no chest pain, shortness of breath, cough, or hemoptysis.  She denies any nausea, vomiting, constipation, or diarrhea.  She has no further melena or hematochezia.  She has no urinary complaints.  Patient offers no further specific complaints today.  REVIEW OF SYSTEMS:   Review of Systems  Constitutional: Negative.  Negative for fever, malaise/fatigue and weight loss.  Respiratory: Negative.  Negative for cough, hemoptysis and shortness of breath.   Cardiovascular: Negative.  Negative for chest pain and leg swelling.  Gastrointestinal: Negative.  Negative for abdominal pain, blood in stool and melena.  Genitourinary: Negative.  Negative for dysuria.  Musculoskeletal: Negative.  Negative for back pain.  Skin: Negative.  Negative for rash.  Neurological:  Positive for tingling and sensory change. Negative for dizziness, seizures, weakness and headaches.  Psychiatric/Behavioral:  The patient is nervous/anxious.    As per HPI. Otherwise, a complete review of systems is negative.  PAST MEDICAL HISTORY: Past Medical History:  Diagnosis Date   Colon cancer ([place]) 08/15/2020   Family history of kidney cancer    Family history of leukemia    Family history of prostate cancer     PAST SURGICAL HISTORY: No past surgical history on  file.  FAMILY HISTORY: Family History  Problem Relation Age of Onset   Breast cancer Cousin        pat cousin   Prostate cancer Mother 89   Kidney cancer Maternal Uncle 69   Leukemia Maternal Grandfather 34    ADVANCED DIRECTIVES (Y/N):  N  HEALTH MAINTENANCE: Social History   Tobacco Use   Smoking status: Never   Smokeless tobacco: Never  Substance Use Topics   Alcohol use: Never   Drug use: Never     Colonoscopy:  PAP:  Bone density:  Lipid panel:  No Known Allergies  Current Outpatient Medications  Medication Sig Dispense Refill   ALPRAZolam (XANAX) 0.25 MG tablet Take 1 tablet (0.25 mg total) by mouth at bedtime as needed for anxiety. 30 tablet 0   cetirizine (ZYRTEC) 10 MG tablet Take 1 tablet by mouth daily.     cholecalciferol (VITAMIN D3) 25 MCG (1000 UNIT) tablet Take 1,000 Units by mouth daily.     docusate sodium (COLACE) 100 MG capsule Take 100 mg by mouth 2 (two) times daily.     lidocaine-prilocaine (EMLA) cream Apply 1 application topically as needed. 30 g 3   Multiple Vitamin (MULTIVITAMIN) tablet Take 1 tablet by mouth daily.     Omega-3 Fatty Acids (FISH OIL) 1000 MG CAPS Take 1 capsule by mouth daily.     polyethylene glycol (MIRALAX / GLYCOLAX) 17 g packet Take 17 g by mouth daily.     No current facility-administered medications for this visit.    OBJECTIVE: Vitals:   06/01/21 1443  BP: (!) 139/95  Pulse: 84  Resp: 17  Temp: 97.9 F (36.6 C)  SpO2: 99%     Body mass index is 42.49 kg/m.    ECOG FS:0 - Asymptomatic  General: Well-developed, well-nourished, no acute distress. Eyes: Pink conjunctiva, anicteric sclera. HEENT: Normocephalic, moist mucous membranes. Lungs: No audible wheezing or coughing. Heart: Regular rate and rhythm. Abdomen: Soft, nontender, no obvious distention. Musculoskeletal: No edema, cyanosis, or clubbing. Neuro: Alert, answering all questions appropriately. Cranial nerves grossly intact. Skin: No rashes or  petechiae noted. Psych: Normal affect.   LAB RESULTS:  Lab Results  Component Value Date   NA 135 05/25/2021   K 4.0 05/25/2021   CL 103 05/25/2021   CO2 26 05/25/2021   GLUCOSE 108 (H) 05/25/2021   BUN 17 05/25/2021   CREATININE 0.78 05/25/2021   CALCIUM 10.9 (H) 05/25/2021   PROT 7.2 05/25/2021   ALBUMIN 3.9 05/25/2021   AST 21 05/25/2021   ALT 16 05/25/2021   ALKPHOS 62 05/25/2021   BILITOT 0.6 05/25/2021   GFRNONAA >60 05/25/2021    Lab Results  Component Value Date   WBC 6.1 05/25/2021   NEUTROABS 3.4 05/25/2021   HGB 14.2 05/25/2021   HCT 41.5 05/25/2021   MCV 96.7 05/25/2021   PLT 233 05/25/2021     STUDIES: CT CHEST ABDOMEN PELVIS W CONTRAST  Result Date: 05/25/2021 CLINICAL DATA:  Colon cancer with metastatic disease liver. Staging. EXAM: CT CHEST, ABDOMEN, AND PELVIS WITH CONTRAST TECHNIQUE: Multidetector CT imaging of the chest, abdomen and pelvis was performed following the standard protocol during bolus administration of intravenous contrast. RADIATION DOSE REDUCTION: This exam was performed according to the departmental dose-optimization program which includes automated exposure control, adjustment of the mA and/or kV according to patient size and/or use of iterative reconstruction technique. CONTRAST:  168mL OMNIPAQUE IOHEXOL 300 MG/ML  SOLN COMPARISON:  No comparison studies available. FINDINGS: CT CHEST FINDINGS Cardiovascular: The heart size is normal. No substantial pericardial effusion. Right Port-A-Cath tip is positioned in the upper right atrium. Mediastinum/Nodes: No mediastinal lymphadenopathy. The esophagus has normal imaging features. Telemetry leads overlie the chest. Lungs/Pleura: No suspicious pulmonary nodule or mass. No focal airspace consolidation. No pleural effusion. Musculoskeletal: No worrisome lytic or sclerotic osseous abnormality. CT ABDOMEN PELVIS FINDINGS Hepatobiliary: 1.9 cm ill-defined hypoenhancing lesion identified anterior hepatic  dome on 50/2. Heterogeneous 2.6 cm lesion noted lateral hepatic dome on 51/2. Subtle tiny hypodensities are seen in the lateral segment left liver with 9 mm lateral segment left liver lesion seen on 49/2. 2.3 cm posterior right hepatic lobe lesion identified on 56/2 with overlying capsular retraction. Gallbladder is nondistended. No intrahepatic or extrahepatic biliary dilation. Pancreas: No focal mass lesion. No dilatation of the main duct. No intraparenchymal cyst. No peripancreatic edema. Spleen: No splenomegaly. No focal mass lesion. Adrenals/Urinary Tract: No adrenal nodule or mass. Kidneys unremarkable. No evidence for hydroureter. The urinary bladder appears normal for the degree of distention. Stomach/Bowel: Tiny hiatal hernia. Stomach otherwise unremarkable. Duodenum is normally positioned as is the ligament of Treitz. No small bowel wall thickening. No small bowel dilatation. The terminal ileum is normal. The appendix is not well visualized, but there is no edema or inflammation in the region of the cecum. Rectosigmoid anastomosis evident. Soft tissue attenuation and tethering is seen in the right adnexal region, involving the right adnexal anatomy, sigmoid colon and apparently the appendix. Vascular/Lymphatic: 1.4 cm short axis lymph node identified anterior to the aortic bifurcation on image 89/2. Small lymph nodes are seen in the ileocolic mesentery. Reproductive: The uterus  is unremarkable.  There is no adnexal mass. Other: No intraperitoneal free fluid. Musculoskeletal: No worrisome lytic or sclerotic osseous abnormality. IMPRESSION: 1. Multiple ill-defined hypoenhancing liver lesions, compatible with metastatic disease. 2. Soft tissue attenuation and tethering in the right adnexal region, involving the right adnexal anatomy, sigmoid colon and apparently the appendix. This is in the region of prior surgery given left hemicolectomy and rectal anastomosis. Features may reflect scarring although  metastatic involvement not excluded and close attention on follow-up recommended. 3. Adenopathy anterior to the aortic bifurcation. Imaging features compatible with metastatic disease. 4. Tiny hiatal hernia. Electronically Signed   By: Misty Stanley M.D.   On: 05/25/2021 13:58    ASSESSMENT: Colon cancer metastasized to liver.  PLAN:    1. Colon cancer metastasized to liver: Patient initially presented to [facility] on on July 28, 2020 and underwent surgical intervention for metastatic colon cancer to the liver.  She was taken to the operating room by Cameron Sprang MD and Binnie Rail MD who performed a robot assisted laparoscopy, robot-assisted partial hepatectomy (Segment 4A/8), microwave ablation (x2, segments 7, 7/8), intraoperative ultrasound and robotic low anterior resection.  Repeat colonoscopy at [facility] in June 2022 was reported as negative.  FOLFOX was discontinued given patient's persistent peripheral neuropathy and she completed cycles 9 through 12 with FOLFIRI.  Her last treatment occurred on February 24, 2021.  CEA is 1.4.  CT scan results from May 25, 2021 reviewed independently and reported as above with ill-defined liver lesions suspicious for recurrence.  We will get a PET scan to further evaluate and consider biopsy if needed.  Return to clinic 1 to 2 days after PET scan to discuss the results and additional diagnostic testing if needed.   2.  Genetics referral: Negative. 3.  Heavy menstrual bleeding: Resolved.  Patient believes she is perimenopausal.  Monitor and consider referral to OB/GYN.   4.  Hypercalcemia: Chronic and unchanged.  Patient's calcium level is 10.9.   5.  Neutropenia: Resolved.   6.  Thrombocytopenia: Resolved. 7.  Anxiety: Continue 0.25 alprazolam as needed. 8.  Peripheral neuropathy: Patient was switched to FOLFIRI as above to complete her adjuvant treatment.  Monitor.  Patient expressed understanding and was in agreement  with this plan. She also understands that She can call clinic at any time with any questions, concerns, or complaints.    Cancer Staging  Colon cancer [facility]) Staging form: Colon and Rectum, AJCC 8th Edition - Clinical stage from 08/15/2020: Stage IVA (cTX, cNX, pM1a) - Signed by Lloyd Huger, MD on 08/15/2020   Lloyd Huger, MD   06/02/2021 7:11 PM

## 2021-06-01 ENCOUNTER — Other Ambulatory Visit: Payer: Self-pay

## 2021-06-01 ENCOUNTER — Inpatient Hospital Stay: Payer: BC Managed Care – PPO

## 2021-06-01 ENCOUNTER — Inpatient Hospital Stay (HOSPITAL_BASED_OUTPATIENT_CLINIC_OR_DEPARTMENT_OTHER): Payer: BC Managed Care – PPO | Admitting: Oncology

## 2021-06-01 VITALS — BP 139/95 | HR 84 | Temp 97.9°F | Resp 17 | Wt 287.7 lb

## 2021-06-01 DIAGNOSIS — C189 Malignant neoplasm of colon, unspecified: Secondary | ICD-10-CM

## 2021-06-01 DIAGNOSIS — Z85038 Personal history of other malignant neoplasm of large intestine: Secondary | ICD-10-CM | POA: Diagnosis not present

## 2021-06-01 NOTE — Progress Notes (Signed)
Pt reports continued neuropathy in hands and feet. Reports Covid before Christmas and is feeling much improved.

## 2021-06-02 ENCOUNTER — Encounter: Payer: Self-pay | Admitting: Oncology

## 2021-06-02 LAB — CEA: CEA: 1.4 ng/mL (ref 0.0–4.7)

## 2021-06-08 ENCOUNTER — Other Ambulatory Visit: Payer: Self-pay

## 2021-06-08 ENCOUNTER — Ambulatory Visit
Admission: RE | Admit: 2021-06-08 | Discharge: 2021-06-08 | Disposition: A | Discharge status: Home / Self Care | Payer: BC Managed Care – PPO | Source: Ambulatory Visit | Attending: Oncology | Admitting: Oncology

## 2021-06-08 DIAGNOSIS — C189 Malignant neoplasm of colon, unspecified: Secondary | ICD-10-CM | POA: Insufficient documentation

## 2021-06-08 DIAGNOSIS — Z08 Encounter for follow-up examination after completed treatment for malignant neoplasm: Secondary | ICD-10-CM | POA: Diagnosis not present

## 2021-06-08 DIAGNOSIS — C787 Secondary malignant neoplasm of liver and intrahepatic bile duct: Secondary | ICD-10-CM | POA: Diagnosis not present

## 2021-06-08 DIAGNOSIS — K449 Diaphragmatic hernia without obstruction or gangrene: Secondary | ICD-10-CM | POA: Insufficient documentation

## 2021-06-08 LAB — GLUCOSE, CAPILLARY: Glucose-Capillary: 120 mg/dL — ABNORMAL HIGH (ref 70–99)

## 2021-06-08 MED ORDER — FLUDEOXYGLUCOSE F - 18 (FDG) INJECTION
14.8000 | Freq: Once | INTRAVENOUS | Status: AC
  Administered 2021-06-08: 15.2 via INTRAVENOUS

## 2021-06-08 NOTE — Progress Notes (Signed)
[name]  [phone]) [phone] Fax:(336) [phone]  ID: Sherri Howell OB: 1970/08/31  MR#: [id number]  TMA#:[id number]  Patient Care Team: Kirk Ruths, MD as PCP - General (Internal Medicine)    CHIEF COMPLAINT: Colon cancer metastasized to liver.  INTERVAL HISTORY: Patient returns to clinic today for further evaluation and discussion of her PET scan results.  She continues to be anxious, but otherwise feels well.  Her peripheral neuropathy is chronic and unchanged.  She denies any pain.  She has no other neurologic complaints.  She denies any recent fevers or illnesses.  She has a fair appetite and denies weight loss.  She has no chest pain, shortness of breath, cough, or hemoptysis.  She denies any nausea, vomiting, constipation, or diarrhea.  She has no further melena or hematochezia.  She has no urinary complaints.  Patient offers no further specific complaints today.  REVIEW OF SYSTEMS:   Review of Systems  Constitutional: Negative.  Negative for fever, malaise/fatigue and weight loss.  Respiratory: Negative.  Negative for cough, hemoptysis and shortness of breath.   Cardiovascular: Negative.  Negative for chest pain and leg swelling.  Gastrointestinal: Negative.  Negative for abdominal pain, blood in stool and melena.  Genitourinary: Negative.  Negative for dysuria.  Musculoskeletal: Negative.  Negative for back pain.  Skin: Negative.  Negative for rash.  Neurological:  Positive for tingling and sensory change. Negative for dizziness, seizures, weakness and headaches.  Psychiatric/Behavioral:  The patient is nervous/anxious.    As per HPI. Otherwise, a complete review of systems is negative.  PAST MEDICAL HISTORY: Past Medical History:  Diagnosis Date   Colon cancer ([place]) 08/15/2020   Family history of kidney cancer    Family history of leukemia    Family history of prostate cancer     PAST SURGICAL HISTORY: History reviewed. No pertinent  surgical history.  FAMILY HISTORY: Family History  Problem Relation Age of Onset   Breast cancer Cousin        pat cousin   Prostate cancer Mother 33   Kidney cancer Maternal Uncle 75   Leukemia Maternal Grandfather 43    ADVANCED DIRECTIVES (Y/N):  N  HEALTH MAINTENANCE: Social History   Tobacco Use   Smoking status: Never   Smokeless tobacco: Never  Substance Use Topics   Alcohol use: Never   Drug use: Never     Colonoscopy:  PAP:  Bone density:  Lipid panel:  No Known Allergies  Current Outpatient Medications  Medication Sig Dispense Refill   ALPRAZolam (XANAX) 0.25 MG tablet Take 1 tablet (0.25 mg total) by mouth at bedtime as needed for anxiety. 30 tablet 0   cetirizine (ZYRTEC) 10 MG tablet Take 1 tablet by mouth daily.     cholecalciferol (VITAMIN D3) 25 MCG (1000 UNIT) tablet Take 1,000 Units by mouth daily.     docusate sodium (COLACE) 100 MG capsule Take 100 mg by mouth 2 (two) times daily.     lidocaine-prilocaine (EMLA) cream Apply 1 application topically as needed. 30 g 3   Multiple Vitamin (MULTIVITAMIN) tablet Take 1 tablet by mouth daily.     Omega-3 Fatty Acids (FISH OIL) 1000 MG CAPS Take 1 capsule by mouth daily.     polyethylene glycol (MIRALAX / GLYCOLAX) 17 g packet Take 17 g by mouth daily.     No current facility-administered medications for this visit.    OBJECTIVE: Vitals:   06/10/21 1048  BP: (!) 131/91  Pulse: 78  Temp: Marland Kitchen)  96.7 F (35.9 C)     Body mass index is 42.56 kg/m.    ECOG FS:0 - Asymptomatic  General: Well-developed, well-nourished, no acute distress. Eyes: Pink conjunctiva, anicteric sclera. HEENT: Normocephalic, moist mucous membranes. Lungs: No audible wheezing or coughing. Heart: Regular rate and rhythm. Abdomen: Soft, nontender, no obvious distention. Musculoskeletal: No edema, cyanosis, or clubbing. Neuro: Alert, answering all questions appropriately. Cranial nerves grossly intact. Skin: No rashes or  petechiae noted. Psych: Normal affect.  LAB RESULTS:  Lab Results  Component Value Date   NA 135 05/25/2021   K 4.0 05/25/2021   CL 103 05/25/2021   CO2 26 05/25/2021   GLUCOSE 108 (H) 05/25/2021   BUN 17 05/25/2021   CREATININE 0.78 05/25/2021   CALCIUM 10.9 (H) 05/25/2021   PROT 7.2 05/25/2021   ALBUMIN 3.9 05/25/2021   AST 21 05/25/2021   ALT 16 05/25/2021   ALKPHOS 62 05/25/2021   BILITOT 0.6 05/25/2021   GFRNONAA >60 05/25/2021    Lab Results  Component Value Date   WBC 6.1 05/25/2021   NEUTROABS 3.4 05/25/2021   HGB 14.2 05/25/2021   HCT 41.5 05/25/2021   MCV 96.7 05/25/2021   PLT 233 05/25/2021     STUDIES: CT CHEST ABDOMEN PELVIS W CONTRAST  Result Date: 05/25/2021 CLINICAL DATA:  Colon cancer with metastatic disease liver. Staging. EXAM: CT CHEST, ABDOMEN, AND PELVIS WITH CONTRAST TECHNIQUE: Multidetector CT imaging of the chest, abdomen and pelvis was performed following the standard protocol during bolus administration of intravenous contrast. RADIATION DOSE REDUCTION: This exam was performed according to the departmental dose-optimization program which includes automated exposure control, adjustment of the mA and/or kV according to patient size and/or use of iterative reconstruction technique. CONTRAST:  122mL OMNIPAQUE IOHEXOL 300 MG/ML  SOLN COMPARISON:  No comparison studies available. FINDINGS: CT CHEST FINDINGS Cardiovascular: The heart size is normal. No substantial pericardial effusion. Right Port-A-Cath tip is positioned in the upper right atrium. Mediastinum/Nodes: No mediastinal lymphadenopathy. The esophagus has normal imaging features. Telemetry leads overlie the chest. Lungs/Pleura: No suspicious pulmonary nodule or mass. No focal airspace consolidation. No pleural effusion. Musculoskeletal: No worrisome lytic or sclerotic osseous abnormality. CT ABDOMEN PELVIS FINDINGS Hepatobiliary: 1.9 cm ill-defined hypoenhancing lesion identified anterior hepatic  dome on 50/2. Heterogeneous 2.6 cm lesion noted lateral hepatic dome on 51/2. Subtle tiny hypodensities are seen in the lateral segment left liver with 9 mm lateral segment left liver lesion seen on 49/2. 2.3 cm posterior right hepatic lobe lesion identified on 56/2 with overlying capsular retraction. Gallbladder is nondistended. No intrahepatic or extrahepatic biliary dilation. Pancreas: No focal mass lesion. No dilatation of the main duct. No intraparenchymal cyst. No peripancreatic edema. Spleen: No splenomegaly. No focal mass lesion. Adrenals/Urinary Tract: No adrenal nodule or mass. Kidneys unremarkable. No evidence for hydroureter. The urinary bladder appears normal for the degree of distention. Stomach/Bowel: Tiny hiatal hernia. Stomach otherwise unremarkable. Duodenum is normally positioned as is the ligament of Treitz. No small bowel wall thickening. No small bowel dilatation. The terminal ileum is normal. The appendix is not well visualized, but there is no edema or inflammation in the region of the cecum. Rectosigmoid anastomosis evident. Soft tissue attenuation and tethering is seen in the right adnexal region, involving the right adnexal anatomy, sigmoid colon and apparently the appendix. Vascular/Lymphatic: 1.4 cm short axis lymph node identified anterior to the aortic bifurcation on image 89/2. Small lymph nodes are seen in the ileocolic mesentery. Reproductive: The uterus is unremarkable.  There is  no adnexal mass. Other: No intraperitoneal free fluid. Musculoskeletal: No worrisome lytic or sclerotic osseous abnormality. IMPRESSION: 1. Multiple ill-defined hypoenhancing liver lesions, compatible with metastatic disease. 2. Soft tissue attenuation and tethering in the right adnexal region, involving the right adnexal anatomy, sigmoid colon and apparently the appendix. This is in the region of prior surgery given left hemicolectomy and rectal anastomosis. Features may reflect scarring although  metastatic involvement not excluded and close attention on follow-up recommended. 3. Adenopathy anterior to the aortic bifurcation. Imaging features compatible with metastatic disease. 4. Tiny hiatal hernia. Electronically Signed   By: Misty Stanley M.D.   On: 05/25/2021 13:58   NM PET Image Initial (PI) Skull Base To Thigh (F-18 FDG)  Result Date: 06/08/2021 CLINICAL DATA:  Subsequent treatment strategy for colon cancer. EXAM: NUCLEAR MEDICINE PET SKULL BASE TO THIGH TECHNIQUE: 15.2 mCi F-18 FDG was injected intravenously. Full-ring PET imaging was performed from the skull base to thigh after the radiotracer. CT data was obtained and used for attenuation correction and anatomic localization. Fasting blood glucose: 120 mg/dl COMPARISON:  CT chest abdomen pelvis 05/25/2021. FINDINGS: Mediastinal blood pool activity: SUV max 2.8 Liver activity: SUV max NA NECK: No hypermetabolic lymph nodes in the neck. Incidental CT findings: none CHEST: No hypermetabolic mediastinal or hilar nodes. No suspicious pulmonary nodules on the CT scan. Incidental CT findings: Right Port-A-Cath tip is positioned in the right atrium. Tiny hiatal hernia noted. ABDOMEN/PELVIS: Subcapsular liver metastases in the dome of the liver were better seen on the previous CT performed with intravenous contrast material. The more anterior lesion measures approximately 2.4 cm today with SUV max = 6.9. The more posterior lesion measures approximately 3.7 cm today (139/3) with SUV max = 8.4. 3rd lesion identified posterior right liver with overlying capsular retraction is similar to prior CT and shows no hypermetabolism today (axial CT image 141/3). 14 mm short axis lymph node anterior to the aortic bifurcation is markedly hypermetabolic with SUV max = 9.8. No evidence for hypermetabolism in the right lower quadrant/right adnexal region in the area of apparent scarring/tethering described in the report for the previous CT scan. Incidental CT findings:  Bladder is decompressed which may account for the a plan circumferential bladder wall thickening. Retained contrast or appendicolith noted in the base of the appendix. SKELETON: No focal hypermetabolic activity to suggest skeletal metastasis. Incidental CT findings: none IMPRESSION: 1. Hypermetabolic subcapsular metastases identified anteriorly and posteriorly in the hepatic dome. There is a third low-attenuation lesion in the posterior right liver with overlying capsular retraction and demonstrates no hypermetabolism on today's PET study. 2. 14 mm short axis retroperitoneal lymph node anterior to the aortic bifurcation is stable since 05/25/2021 and demonstrates marked hypermetabolism consistent with metastatic disease. 3. No other hypermetabolic metastatic disease identified in the neck, chest, abdomen, or pelvis. 4. Tiny hiatal hernia. Electronically Signed   By: Misty Stanley M.D.   On: 06/08/2021 10:03    ASSESSMENT: Colon cancer metastasized to liver.  PLAN:    1. Colon cancer metastasized to liver: Patient initially presented to [facility] on on July 28, 2020 and underwent surgical intervention for metastatic colon cancer to the liver.  She was taken to the operating room by Cameron Sprang MD and Binnie Rail MD who performed a robot assisted laparoscopy, robot-assisted partial hepatectomy (Segment 4A/8), microwave ablation (x2, segments 7, 7/8), intraoperative ultrasound and robotic low anterior resection.  Repeat colonoscopy at [facility] in June 2022 was reported as negative.  FOLFOX  was discontinued given patient's persistent peripheral neuropathy and she completed cycles 9 through 12 with FOLFIRI.  Her last treatment occurred on February 24, 2021.  CEA is 1.4.  CT scan results from May 25, 2021 reviewed independently and reported as above with ill-defined liver lesions suspicious for recurrence.  PET scan results from June 08, 2021 reviewed independently and  reported as above with hypermetabolic subscapular metastasis highly suspicious for recurrence.  Third low-attenuation lesion is not hypermetabolic.  Patient has consultation at [facility] next week for consideration of ablation or possible resection.  Agree that this is her best course of treatment rather than using systemic chemotherapy.  We will have video-assisted telemedicine visit in 1 week, after her evaluation, for further discussion and treatment planning.   2.  Genetics referral: Negative. 3.  Heavy menstrual bleeding: Resolved.  Patient believes she is perimenopausal.  Monitor and consider referral to OB/GYN.   4.  Hypercalcemia: Chronic and unchanged.  Patient's calcium level is 10.9.   5.  Neutropenia: Resolved.   6.  Thrombocytopenia: Resolved. 7.  Anxiety: Continue 0.25 alprazolam as needed. 8.  Peripheral neuropathy: Patient was switched to FOLFIRI as above to complete her adjuvant treatment.  Monitor.  I spent a total of 20 minutes reviewing chart data, face-to-face evaluation with the patient, counseling and coordination of care as detailed above.   Patient expressed understanding and was in agreement with this plan. She also understands that She can call clinic at any time with any questions, concerns, or complaints.    Cancer Staging  Colon cancer [facility]) Staging form: Colon and Rectum, AJCC 8th Edition - Clinical stage from 08/15/2020: Stage IVA (cTX, cNX, pM1a) - Signed by Lloyd Huger, MD on 08/15/2020   Lloyd Huger, MD   06/11/2021 7:21 PM

## 2021-06-10 ENCOUNTER — Encounter: Payer: Self-pay | Admitting: Oncology

## 2021-06-10 ENCOUNTER — Inpatient Hospital Stay: Payer: BC Managed Care – PPO | Attending: Oncology | Admitting: Oncology

## 2021-06-10 ENCOUNTER — Other Ambulatory Visit: Payer: Self-pay

## 2021-06-10 VITALS — BP 131/91 | HR 78 | Temp 96.7°F | Wt 288.2 lb

## 2021-06-10 DIAGNOSIS — C787 Secondary malignant neoplasm of liver and intrahepatic bile duct: Secondary | ICD-10-CM | POA: Diagnosis present

## 2021-06-10 DIAGNOSIS — F419 Anxiety disorder, unspecified: Secondary | ICD-10-CM | POA: Diagnosis not present

## 2021-06-10 DIAGNOSIS — C189 Malignant neoplasm of colon, unspecified: Secondary | ICD-10-CM | POA: Diagnosis not present

## 2021-06-10 DIAGNOSIS — Z79899 Other long term (current) drug therapy: Secondary | ICD-10-CM | POA: Diagnosis not present

## 2021-06-10 DIAGNOSIS — G629 Polyneuropathy, unspecified: Secondary | ICD-10-CM | POA: Insufficient documentation

## 2021-06-11 ENCOUNTER — Encounter: Payer: Self-pay | Admitting: Oncology

## 2021-06-17 ENCOUNTER — Other Ambulatory Visit: Payer: Self-pay

## 2021-06-17 ENCOUNTER — Encounter: Payer: Self-pay | Admitting: Oncology

## 2021-06-17 ENCOUNTER — Inpatient Hospital Stay (HOSPITAL_BASED_OUTPATIENT_CLINIC_OR_DEPARTMENT_OTHER): Payer: BC Managed Care – PPO | Admitting: Oncology

## 2021-06-17 DIAGNOSIS — C189 Malignant neoplasm of colon, unspecified: Secondary | ICD-10-CM | POA: Diagnosis not present

## 2021-06-17 NOTE — Progress Notes (Signed)
[place]  [phone] [phone]  ID: Sherri Howell OB: [birth year]  MR#: [id number]  WNU#:[id number]  Patient Care Team: Kirk Ruths, MD as PCP - General (Internal Medicine)    I connected with Sherri Howell on 06/18/21 at 11:15 AM EST by video enabled telemedicine visit and verified that I am speaking with the correct person using two identifiers.   I discussed the limitations, risks, security and privacy concerns of performing an evaluation and management service by telemedicine and the availability of in-person appointments. I also discussed with the patient that there may be a patient responsible charge related to this service. The patient expressed understanding and agreed to proceed.   Other persons participating in the visit and their role in the encounter: Patient, MD.  Patients location: Home. Providers location: Clinic.  CHIEF COMPLAINT: Colon cancer metastasized to liver.  INTERVAL HISTORY: Patient agreed to video assisted telemedicine visit for further evaluation and discussion of her imaging and consultation with her surgeon at [facility]. Her peripheral neuropathy is chronic and unchanged.  She denies any pain.  She has no other neurologic complaints.  She denies any recent fevers or illnesses.  She has a fair appetite and denies weight loss.  She has no chest pain, shortness of breath, cough, or hemoptysis.  She denies any nausea, vomiting, constipation, or diarrhea.  She has no further melena or hematochezia.  She has no urinary complaints.  Patient offers no further specific complaints today.  REVIEW OF SYSTEMS:   Review of Systems  Constitutional: Negative.  Negative for fever, malaise/fatigue and weight loss.  Respiratory: Negative.  Negative for cough, hemoptysis and shortness of breath.   Cardiovascular: Negative.  Negative for chest pain and leg swelling.  Gastrointestinal: Negative.  Negative for abdominal  pain, blood in stool and melena.  Genitourinary: Negative.  Negative for dysuria.  Musculoskeletal: Negative.  Negative for back pain.  Skin: Negative.  Negative for rash.  Neurological:  Positive for tingling and sensory change. Negative for dizziness, seizures, weakness and headaches.  Psychiatric/Behavioral: Negative.  The patient is not nervous/anxious.    As per HPI. Otherwise, a complete review of systems is negative.  PAST MEDICAL HISTORY: Past Medical History:  Diagnosis Date   Colon cancer ([place]) 08/15/2020   Family history of kidney cancer    Family history of leukemia    Family history of prostate cancer     PAST SURGICAL HISTORY: History reviewed. No pertinent surgical history.  FAMILY HISTORY: Family History  Problem Relation Age of Onset   Breast cancer Cousin        pat cousin   Prostate cancer Mother 33   Kidney cancer Maternal Uncle 92   Leukemia Maternal Grandfather 3    ADVANCED DIRECTIVES (Y/N):  N  HEALTH MAINTENANCE: Social History   Tobacco Use   Smoking status: Never   Smokeless tobacco: Never  Substance Use Topics   Alcohol use: Never   Drug use: Never     Colonoscopy:  PAP:  Bone density:  Lipid panel:  No Known Allergies  Current Outpatient Medications  Medication Sig Dispense Refill   ALPRAZolam (XANAX) 0.25 MG tablet Take 1 tablet (0.25 mg total) by mouth at bedtime as needed for anxiety. 30 tablet 0   cetirizine (ZYRTEC) 10 MG tablet Take 1 tablet by mouth daily.     cholecalciferol (VITAMIN D3) 25 MCG (1000 UNIT) tablet Take 1,000 Units by mouth daily.     docusate sodium (COLACE) 100 MG  capsule Take 100 mg by mouth 2 (two) times daily.     lidocaine-prilocaine (EMLA) cream Apply 1 application topically as needed. 30 g 3   Multiple Vitamin (MULTIVITAMIN) tablet Take 1 tablet by mouth daily.     Omega-3 Fatty Acids (FISH OIL) 1000 MG CAPS Take 1 capsule by mouth daily.     polyethylene glycol (MIRALAX / GLYCOLAX) 17 g packet Take  17 g by mouth daily.     No current facility-administered medications for this visit.    OBJECTIVE: There were no vitals filed for this visit.    There is no height or weight on file to calculate BMI.    ECOG FS:0 - Asymptomatic  General: Well-developed, well-nourished, no acute distress. HEENT: Normocephalic. Neuro: Alert, answering all questions appropriately. Cranial nerves grossly intact. Psych: Normal affect.  LAB RESULTS:  Lab Results  Component Value Date   NA 135 05/25/2021   K 4.0 05/25/2021   CL 103 05/25/2021   CO2 26 05/25/2021   GLUCOSE 108 (H) 05/25/2021   BUN 17 05/25/2021   CREATININE 0.78 05/25/2021   CALCIUM 10.9 (H) 05/25/2021   PROT 7.2 05/25/2021   ALBUMIN 3.9 05/25/2021   AST 21 05/25/2021   ALT 16 05/25/2021   ALKPHOS 62 05/25/2021   BILITOT 0.6 05/25/2021   GFRNONAA >60 05/25/2021    Lab Results  Component Value Date   WBC 6.1 05/25/2021   NEUTROABS 3.4 05/25/2021   HGB 14.2 05/25/2021   HCT 41.5 05/25/2021   MCV 96.7 05/25/2021   PLT 233 05/25/2021     STUDIES: CT CHEST ABDOMEN PELVIS W CONTRAST  Result Date: 05/25/2021 CLINICAL DATA:  Colon cancer with metastatic disease liver. Staging. EXAM: CT CHEST, ABDOMEN, AND PELVIS WITH CONTRAST TECHNIQUE: Multidetector CT imaging of the chest, abdomen and pelvis was performed following the standard protocol during bolus administration of intravenous contrast. RADIATION DOSE REDUCTION: This exam was performed according to the departmental dose-optimization program which includes automated exposure control, adjustment of the mA and/or kV according to patient size and/or use of iterative reconstruction technique. CONTRAST:  129mL OMNIPAQUE IOHEXOL 300 MG/ML  SOLN COMPARISON:  No comparison studies available. FINDINGS: CT CHEST FINDINGS Cardiovascular: The heart size is normal. No substantial pericardial effusion. Right Port-A-Cath tip is positioned in the upper right atrium. Mediastinum/Nodes: No  mediastinal lymphadenopathy. The esophagus has normal imaging features. Telemetry leads overlie the chest. Lungs/Pleura: No suspicious pulmonary nodule or mass. No focal airspace consolidation. No pleural effusion. Musculoskeletal: No worrisome lytic or sclerotic osseous abnormality. CT ABDOMEN PELVIS FINDINGS Hepatobiliary: 1.9 cm ill-defined hypoenhancing lesion identified anterior hepatic dome on 50/2. Heterogeneous 2.6 cm lesion noted lateral hepatic dome on 51/2. Subtle tiny hypodensities are seen in the lateral segment left liver with 9 mm lateral segment left liver lesion seen on 49/2. 2.3 cm posterior right hepatic lobe lesion identified on 56/2 with overlying capsular retraction. Gallbladder is nondistended. No intrahepatic or extrahepatic biliary dilation. Pancreas: No focal mass lesion. No dilatation of the main duct. No intraparenchymal cyst. No peripancreatic edema. Spleen: No splenomegaly. No focal mass lesion. Adrenals/Urinary Tract: No adrenal nodule or mass. Kidneys unremarkable. No evidence for hydroureter. The urinary bladder appears normal for the degree of distention. Stomach/Bowel: Tiny hiatal hernia. Stomach otherwise unremarkable. Duodenum is normally positioned as is the ligament of Treitz. No small bowel wall thickening. No small bowel dilatation. The terminal ileum is normal. The appendix is not well visualized, but there is no edema or inflammation in the region of the  cecum. Rectosigmoid anastomosis evident. Soft tissue attenuation and tethering is seen in the right adnexal region, involving the right adnexal anatomy, sigmoid colon and apparently the appendix. Vascular/Lymphatic: 1.4 cm short axis lymph node identified anterior to the aortic bifurcation on image 89/2. Small lymph nodes are seen in the ileocolic mesentery. Reproductive: The uterus is unremarkable.  There is no adnexal mass. Other: No intraperitoneal free fluid. Musculoskeletal: No worrisome lytic or sclerotic osseous  abnormality. IMPRESSION: 1. Multiple ill-defined hypoenhancing liver lesions, compatible with metastatic disease. 2. Soft tissue attenuation and tethering in the right adnexal region, involving the right adnexal anatomy, sigmoid colon and apparently the appendix. This is in the region of prior surgery given left hemicolectomy and rectal anastomosis. Features may reflect scarring although metastatic involvement not excluded and close attention on follow-up recommended. 3. Adenopathy anterior to the aortic bifurcation. Imaging features compatible with metastatic disease. 4. Tiny hiatal hernia. Electronically Signed   By: Misty Stanley M.D.   On: 05/25/2021 13:58   NM PET Image Initial (PI) Skull Base To Thigh (F-18 FDG)  Result Date: 06/08/2021 CLINICAL DATA:  Subsequent treatment strategy for colon cancer. EXAM: NUCLEAR MEDICINE PET SKULL BASE TO THIGH TECHNIQUE: 15.2 mCi F-18 FDG was injected intravenously. Full-ring PET imaging was performed from the skull base to thigh after the radiotracer. CT data was obtained and used for attenuation correction and anatomic localization. Fasting blood glucose: 120 mg/dl COMPARISON:  CT chest abdomen pelvis 05/25/2021. FINDINGS: Mediastinal blood pool activity: SUV max 2.8 Liver activity: SUV max NA NECK: No hypermetabolic lymph nodes in the neck. Incidental CT findings: none CHEST: No hypermetabolic mediastinal or hilar nodes. No suspicious pulmonary nodules on the CT scan. Incidental CT findings: Right Port-A-Cath tip is positioned in the right atrium. Tiny hiatal hernia noted. ABDOMEN/PELVIS: Subcapsular liver metastases in the dome of the liver were better seen on the previous CT performed with intravenous contrast material. The more anterior lesion measures approximately 2.4 cm today with SUV max = 6.9. The more posterior lesion measures approximately 3.7 cm today (139/3) with SUV max = 8.4. 3rd lesion identified posterior right liver with overlying capsular retraction  is similar to prior CT and shows no hypermetabolism today (axial CT image 141/3). 14 mm short axis lymph node anterior to the aortic bifurcation is markedly hypermetabolic with SUV max = 9.8. No evidence for hypermetabolism in the right lower quadrant/right adnexal region in the area of apparent scarring/tethering described in the report for the previous CT scan. Incidental CT findings: Bladder is decompressed which may account for the a plan circumferential bladder wall thickening. Retained contrast or appendicolith noted in the base of the appendix. SKELETON: No focal hypermetabolic activity to suggest skeletal metastasis. Incidental CT findings: none IMPRESSION: 1. Hypermetabolic subcapsular metastases identified anteriorly and posteriorly in the hepatic dome. There is a third low-attenuation lesion in the posterior right liver with overlying capsular retraction and demonstrates no hypermetabolism on today's PET study. 2. 14 mm short axis retroperitoneal lymph node anterior to the aortic bifurcation is stable since 05/25/2021 and demonstrates marked hypermetabolism consistent with metastatic disease. 3. No other hypermetabolic metastatic disease identified in the neck, chest, abdomen, or pelvis. 4. Tiny hiatal hernia. Electronically Signed   By: Misty Stanley M.D.   On: 06/08/2021 10:03    ASSESSMENT: Colon cancer metastasized to liver.  PLAN:    1. Colon cancer metastasized to liver: Patient initially presented to [facility] on on July 28, 2020 and underwent surgical intervention for metastatic  colon cancer to the liver.  She was taken to the operating room by Cameron Sprang MD and Binnie Rail MD who performed a robot assisted laparoscopy, robot-assisted partial hepatectomy (Segment 4A/8), microwave ablation (x2, segments 7, 7/8), intraoperative ultrasound and robotic low anterior resection.  Repeat colonoscopy at [facility] in June 2022 was reported as negative.  FOLFOX was  discontinued given patient's persistent peripheral neuropathy and she completed cycles 9 through 12 with FOLFIRI.  Her last treatment occurred on February 24, 2021.  CEA is 1.4.  CT scan results from May 25, 2021 reviewed independently and reported as above with ill-defined liver lesions suspicious for recurrence.  PET scan results from June 08, 2021 reviewed independently with hypermetabolic subscapular metastasis highly suspicious for recurrence.  Third low-attenuation lesion is not hypermetabolic.  MRI from [facility] confirms recurrent disease, but patient will be discussed by her surgeon at their GI tumor board next week and will await their recommendations prior to initiating treatment.  Patient will have video-assisted telemedicine visit in 1 week for further evaluation and treatment planning.   2.  Genetics referral: Negative. 3.  Heavy menstrual bleeding: Resolved.  Patient believes she is perimenopausal.  Monitor and consider referral to OB/GYN.   4.  Hypercalcemia: Chronic and unchanged.  Patient's most recent calcium level was 10.9 5.  Neutropenia: Resolved.   6.  Thrombocytopenia: Resolved. 7.  Anxiety: Continue 0.25 alprazolam as needed. 8.  Peripheral neuropathy: Patient was switched to FOLFIRI as above to complete her adjuvant treatment.    I provided 20 minutes of face-to-face video visit time during this encounter which included chart review, counseling, and coordination of care as documented above.   Patient expressed understanding and was in agreement with this plan. She also understands that She can call clinic at any time with any questions, concerns, or complaints.    Cancer Staging  Colon cancer [facility]) Staging form: Colon and Rectum, AJCC 8th Edition - Clinical stage from 08/15/2020: Stage IVA (cTX, cNX, pM1a) - Signed by Lloyd Huger, MD on 08/15/2020   Lloyd Huger, MD   06/18/2021 12:04 PM

## 2021-06-17 NOTE — Progress Notes (Signed)
Patient pre-screened for video visit. She reports that she is seeing her surgeon at [facility] due to disease reoccurrence.

## 2021-06-18 ENCOUNTER — Encounter: Payer: Self-pay | Admitting: Oncology

## 2021-06-21 NOTE — Progress Notes (Unsigned)
Sherri Howell  [phone] [phone]  ID: Sherri Howell OB: [birth year]  MR#: [id number]  PHX#:[id number]  Patient Care Team: Kirk Ruths, MD as PCP - General (Internal Medicine)    CHIEF COMPLAINT: Colon cancer metastasized to liver.  INTERVAL HISTORY: Patient agreed to video assisted telemedicine visit for further evaluation and discussion of her imaging and consultation with her surgeon at [facility]. Her peripheral neuropathy is chronic and unchanged.  She denies any pain.  She has no other neurologic complaints.  She denies any recent fevers or illnesses.  She has a fair appetite and denies weight loss.  She has no chest pain, shortness of breath, cough, or hemoptysis.  She denies any nausea, vomiting, constipation, or diarrhea.  She has no further melena or hematochezia.  She has no urinary complaints.  Patient offers no further specific complaints today.  REVIEW OF SYSTEMS:   Review of Systems  Constitutional: Negative.  Negative for fever, malaise/fatigue and weight loss.  Respiratory: Negative.  Negative for cough, hemoptysis and shortness of breath.   Cardiovascular: Negative.  Negative for chest pain and leg swelling.  Gastrointestinal: Negative.  Negative for abdominal pain, blood in stool and melena.  Genitourinary: Negative.  Negative for dysuria.  Musculoskeletal: Negative.  Negative for back pain.  Skin: Negative.  Negative for rash.  Neurological:  Positive for tingling and sensory change. Negative for dizziness, seizures, weakness and headaches.  Psychiatric/Behavioral: Negative.  The patient is not nervous/anxious.    As per HPI. Otherwise, a complete review of systems is negative.  PAST MEDICAL HISTORY: Past Medical History:  Diagnosis Date   Colon cancer ([place]) 08/15/2020   Family history of kidney cancer    Family history of leukemia    Family history of prostate cancer     PAST SURGICAL HISTORY: No past  surgical history on file.  FAMILY HISTORY: Family History  Problem Relation Age of Onset   Breast cancer Cousin        pat cousin   Prostate cancer Mother 90   Kidney cancer Maternal Uncle 65   Leukemia Maternal Grandfather 37    ADVANCED DIRECTIVES (Y/N):  N  HEALTH MAINTENANCE: Social History   Tobacco Use   Smoking status: Never   Smokeless tobacco: Never  Substance Use Topics   Alcohol use: Never   Drug use: Never     Colonoscopy:  PAP:  Bone density:  Lipid panel:  No Known Allergies  Current Outpatient Medications  Medication Sig Dispense Refill   ALPRAZolam (XANAX) 0.25 MG tablet Take 1 tablet (0.25 mg total) by mouth at bedtime as needed for anxiety. 30 tablet 0   cetirizine (ZYRTEC) 10 MG tablet Take 1 tablet by mouth daily.     cholecalciferol (VITAMIN D3) 25 MCG (1000 UNIT) tablet Take 1,000 Units by mouth daily.     docusate sodium (COLACE) 100 MG capsule Take 100 mg by mouth 2 (two) times daily.     lidocaine-prilocaine (EMLA) cream Apply 1 application topically as needed. 30 g 3   Multiple Vitamin (MULTIVITAMIN) tablet Take 1 tablet by mouth daily.     Omega-3 Fatty Acids (FISH OIL) 1000 MG CAPS Take 1 capsule by mouth daily.     polyethylene glycol (MIRALAX / GLYCOLAX) 17 g packet Take 17 g by mouth daily.     No current facility-administered medications for this visit.    OBJECTIVE: There were no vitals filed for this visit.    There is no height or  weight on file to calculate BMI.    ECOG FS:0 - Asymptomatic  General: Well-developed, well-nourished, no acute distress. HEENT: Normocephalic. Neuro: Alert, answering all questions appropriately. Cranial nerves grossly intact. Psych: Normal affect.  LAB RESULTS:  Lab Results  Component Value Date   NA 135 05/25/2021   K 4.0 05/25/2021   CL 103 05/25/2021   CO2 26 05/25/2021   GLUCOSE 108 (H) 05/25/2021   BUN 17 05/25/2021   CREATININE 0.78 05/25/2021   CALCIUM 10.9 (H) 05/25/2021   PROT  7.2 05/25/2021   ALBUMIN 3.9 05/25/2021   AST 21 05/25/2021   ALT 16 05/25/2021   ALKPHOS 62 05/25/2021   BILITOT 0.6 05/25/2021   GFRNONAA >60 05/25/2021    Lab Results  Component Value Date   WBC 6.1 05/25/2021   NEUTROABS 3.4 05/25/2021   HGB 14.2 05/25/2021   HCT 41.5 05/25/2021   MCV 96.7 05/25/2021   PLT 233 05/25/2021     STUDIES: CT CHEST ABDOMEN PELVIS W CONTRAST  Result Date: 05/25/2021 CLINICAL DATA:  Colon cancer with metastatic disease liver. Staging. EXAM: CT CHEST, ABDOMEN, AND PELVIS WITH CONTRAST TECHNIQUE: Multidetector CT imaging of the chest, abdomen and pelvis was performed following the standard protocol during bolus administration of intravenous contrast. RADIATION DOSE REDUCTION: This exam was performed according to the departmental dose-optimization program which includes automated exposure control, adjustment of the mA and/or kV according to patient size and/or use of iterative reconstruction technique. CONTRAST:  167mL OMNIPAQUE IOHEXOL 300 MG/ML  SOLN COMPARISON:  No comparison studies available. FINDINGS: CT CHEST FINDINGS Cardiovascular: The heart size is normal. No substantial pericardial effusion. Right Port-A-Cath tip is positioned in the upper right atrium. Mediastinum/Nodes: No mediastinal lymphadenopathy. The esophagus has normal imaging features. Telemetry leads overlie the chest. Lungs/Pleura: No suspicious pulmonary nodule or mass. No focal airspace consolidation. No pleural effusion. Musculoskeletal: No worrisome lytic or sclerotic osseous abnormality. CT ABDOMEN PELVIS FINDINGS Hepatobiliary: 1.9 cm ill-defined hypoenhancing lesion identified anterior hepatic dome on 50/2. Heterogeneous 2.6 cm lesion noted lateral hepatic dome on 51/2. Subtle tiny hypodensities are seen in the lateral segment left liver with 9 mm lateral segment left liver lesion seen on 49/2. 2.3 cm posterior right hepatic lobe lesion identified on 56/2 with overlying capsular  retraction. Gallbladder is nondistended. No intrahepatic or extrahepatic biliary dilation. Pancreas: No focal mass lesion. No dilatation of the main duct. No intraparenchymal cyst. No peripancreatic edema. Spleen: No splenomegaly. No focal mass lesion. Adrenals/Urinary Tract: No adrenal nodule or mass. Kidneys unremarkable. No evidence for hydroureter. The urinary bladder appears normal for the degree of distention. Stomach/Bowel: Tiny hiatal hernia. Stomach otherwise unremarkable. Duodenum is normally positioned as is the ligament of Treitz. No small bowel wall thickening. No small bowel dilatation. The terminal ileum is normal. The appendix is not well visualized, but there is no edema or inflammation in the region of the cecum. Rectosigmoid anastomosis evident. Soft tissue attenuation and tethering is seen in the right adnexal region, involving the right adnexal anatomy, sigmoid colon and apparently the appendix. Vascular/Lymphatic: 1.4 cm short axis lymph node identified anterior to the aortic bifurcation on image 89/2. Small lymph nodes are seen in the ileocolic mesentery. Reproductive: The uterus is unremarkable.  There is no adnexal mass. Other: No intraperitoneal free fluid. Musculoskeletal: No worrisome lytic or sclerotic osseous abnormality. IMPRESSION: 1. Multiple ill-defined hypoenhancing liver lesions, compatible with metastatic disease. 2. Soft tissue attenuation and tethering in the right adnexal region, involving the right adnexal anatomy, sigmoid colon  and apparently the appendix. This is in the region of prior surgery given left hemicolectomy and rectal anastomosis. Features may reflect scarring although metastatic involvement not excluded and close attention on follow-up recommended. 3. Adenopathy anterior to the aortic bifurcation. Imaging features compatible with metastatic disease. 4. Tiny hiatal hernia. Electronically Signed   By: Misty Stanley M.D.   On: 05/25/2021 13:58   NM PET Image  Initial (PI) Skull Base To Thigh (F-18 FDG)  Result Date: 06/08/2021 CLINICAL DATA:  Subsequent treatment strategy for colon cancer. EXAM: NUCLEAR MEDICINE PET SKULL BASE TO THIGH TECHNIQUE: 15.2 mCi F-18 FDG was injected intravenously. Full-ring PET imaging was performed from the skull base to thigh after the radiotracer. CT data was obtained and used for attenuation correction and anatomic localization. Fasting blood glucose: 120 mg/dl COMPARISON:  CT chest abdomen pelvis 05/25/2021. FINDINGS: Mediastinal blood pool activity: SUV max 2.8 Liver activity: SUV max NA NECK: No hypermetabolic lymph nodes in the neck. Incidental CT findings: none CHEST: No hypermetabolic mediastinal or hilar nodes. No suspicious pulmonary nodules on the CT scan. Incidental CT findings: Right Port-A-Cath tip is positioned in the right atrium. Tiny hiatal hernia noted. ABDOMEN/PELVIS: Subcapsular liver metastases in the dome of the liver were better seen on the previous CT performed with intravenous contrast material. The more anterior lesion measures approximately 2.4 cm today with SUV max = 6.9. The more posterior lesion measures approximately 3.7 cm today (139/3) with SUV max = 8.4. 3rd lesion identified posterior right liver with overlying capsular retraction is similar to prior CT and shows no hypermetabolism today (axial CT image 141/3). 14 mm short axis lymph node anterior to the aortic bifurcation is markedly hypermetabolic with SUV max = 9.8. No evidence for hypermetabolism in the right lower quadrant/right adnexal region in the area of apparent scarring/tethering described in the report for the previous CT scan. Incidental CT findings: Bladder is decompressed which may account for the a plan circumferential bladder wall thickening. Retained contrast or appendicolith noted in the base of the appendix. SKELETON: No focal hypermetabolic activity to suggest skeletal metastasis. Incidental CT findings: none IMPRESSION: 1.  Hypermetabolic subcapsular metastases identified anteriorly and posteriorly in the hepatic dome. There is a third low-attenuation lesion in the posterior right liver with overlying capsular retraction and demonstrates no hypermetabolism on today's PET study. 2. 14 mm short axis retroperitoneal lymph node anterior to the aortic bifurcation is stable since 05/25/2021 and demonstrates marked hypermetabolism consistent with metastatic disease. 3. No other hypermetabolic metastatic disease identified in the neck, chest, abdomen, or pelvis. 4. Tiny hiatal hernia. Electronically Signed   By: Misty Stanley M.D.   On: 06/08/2021 10:03    ASSESSMENT: Colon cancer metastasized to liver.  PLAN:    1. Colon cancer metastasized to liver: Patient initially presented to [facility] on on July 28, 2020 and underwent surgical intervention for metastatic colon cancer to the liver.  She was taken to the operating room by Cameron Sprang MD and Binnie Rail MD who performed a robot assisted laparoscopy, robot-assisted partial hepatectomy (Segment 4A/8), microwave ablation (x2, segments 7, 7/8), intraoperative ultrasound and robotic low anterior resection.  Repeat colonoscopy at [facility] in June 2022 was reported as negative.  FOLFOX was discontinued given patient's persistent peripheral neuropathy and she completed cycles 9 through 12 with FOLFIRI.  Her last treatment occurred on February 24, 2021.  CEA is 1.4.  CT scan results from May 25, 2021 reviewed independently and reported as above with ill-defined  liver lesions suspicious for recurrence.  PET scan results from June 08, 2021 reviewed independently with hypermetabolic subscapular metastasis highly suspicious for recurrence.  Third low-attenuation lesion is not hypermetabolic.  MRI from [facility] confirms recurrent disease, but patient will be discussed by her surgeon at their GI tumor board next week and will await their  recommendations prior to initiating treatment.  Patient will have video-assisted telemedicine visit in 1 week for further evaluation and treatment planning.   2.  Genetics referral: Negative. 3.  Heavy menstrual bleeding: Resolved.  Patient believes she is perimenopausal.  Monitor and consider referral to OB/GYN.   4.  Hypercalcemia: Chronic and unchanged.  Patient's most recent calcium level was 10.9 5.  Neutropenia: Resolved.   6.  Thrombocytopenia: Resolved. 7.  Anxiety: Continue 0.25 alprazolam as needed. 8.  Peripheral neuropathy: Patient was switched to FOLFIRI as above to complete her adjuvant treatment.    I provided *** minutes of {Blank single:19197::"face-to-face video visit time","non face-to-face telephone visit time"} during this encounter which included chart review, counseling, and coordination of care as documented above.    Patient expressed understanding and was in agreement with this plan. She also understands that She can call clinic at any time with any questions, concerns, or complaints.    Cancer Staging  Colon cancer [facility]) Staging form: Colon and Rectum, AJCC 8th Edition - Clinical stage from 08/15/2020: Stage IVA (cTX, cNX, pM1a) - Signed by Lloyd Huger, MD on 08/15/2020   Lloyd Huger, MD   06/21/2021 7:39 AM

## 2021-06-22 ENCOUNTER — Telehealth: Payer: Self-pay | Admitting: Oncology

## 2021-06-22 ENCOUNTER — Telehealth: Payer: Self-pay | Admitting: *Deleted

## 2021-06-22 NOTE — Telephone Encounter (Signed)
Pt called to cancel her Mychart visit on 06/25/21. Pt has chosen to transfer all her care to [facility]. She was very Thankful for the care she received her and wanted to make sure that we knew that. I have cancel the follow-up appts.

## 2021-06-22 NOTE — Telephone Encounter (Signed)
Incoming call from Santiago Glad  Dr [phone] nurse clinician stating that patient was discussed at their tumor board this morning and the consensus was that the MRI did reflect recurrent disease and recommendation was to restart chemotherapy, patient is aware of this and already has an appointment with Dr Grayland Ormond  Thursday ( it is a video Visit ) If there are questions she can be reached at [phone]

## 2021-06-24 ENCOUNTER — Inpatient Hospital Stay: Payer: BC Managed Care – PPO | Admitting: Oncology

## 2021-06-24 DIAGNOSIS — C189 Malignant neoplasm of colon, unspecified: Secondary | ICD-10-CM

## 2021-07-09 ENCOUNTER — Other Ambulatory Visit: Payer: Self-pay

## 2021-07-09 ENCOUNTER — Inpatient Hospital Stay (HOSPITAL_COMMUNITY): Payer: BC Managed Care – PPO | Attending: Hematology

## 2021-07-09 ENCOUNTER — Encounter (HOSPITAL_COMMUNITY): Payer: Self-pay

## 2021-07-09 VITALS — BP 139/82 | HR 76 | Temp 98.3°F | Resp 20

## 2021-07-09 DIAGNOSIS — C787 Secondary malignant neoplasm of liver and intrahepatic bile duct: Secondary | ICD-10-CM | POA: Insufficient documentation

## 2021-07-09 DIAGNOSIS — Z452 Encounter for adjustment and management of vascular access device: Secondary | ICD-10-CM | POA: Diagnosis not present

## 2021-07-09 DIAGNOSIS — C189 Malignant neoplasm of colon, unspecified: Secondary | ICD-10-CM | POA: Diagnosis present

## 2021-07-09 DIAGNOSIS — Z95828 Presence of other vascular implants and grafts: Secondary | ICD-10-CM

## 2021-07-09 MED ORDER — SODIUM CHLORIDE 0.9% FLUSH
10.0000 mL | Freq: Once | INTRAVENOUS | Status: AC
  Administered 2021-07-09: 10 mL via INTRAVENOUS

## 2021-07-09 MED ORDER — HEPARIN SOD (PORK) LOCK FLUSH 100 UNIT/ML IV SOLN
500.0000 [IU] | Freq: Once | INTRAVENOUS | Status: AC
  Administered 2021-07-09: 500 [IU] via INTRAVENOUS

## 2021-07-09 NOTE — Patient Instructions (Signed)
[facility]  Discharge Instructions: ?Thank you for choosing [facility] to provide your oncology and hematology care.  ?If you have a lab appointment with the [place], please come in thru the Main Entrance and check in at the main information desk. ? ?Wear comfortable clothing and clothing appropriate for easy access to any Portacath or PICC line.  ? ?We strive to give you quality time with your provider. You may need to reschedule your appointment if you arrive late (15 or more minutes).  Arriving late affects you and other patients whose appointments are after yours.  Also, if you miss three or more appointments without notifying the office, you may be dismissed from the clinic at the provider?s discretion.    ?  ?For prescription refill requests, have your pharmacy contact our office and allow 72 hours for refills to be completed.   ? ?Today your chemo pump was disconnected and port was flushed with good blood return noted. ?  ?To help prevent nausea and vomiting after your treatment, we encourage you to take your nausea medication as directed. ? ?BELOW ARE SYMPTOMS THAT SHOULD BE REPORTED IMMEDIATELY: ?*FEVER GREATER THAN 100.4 F (38 ?C) OR HIGHER ?*CHILLS OR SWEATING ?*NAUSEA AND VOMITING THAT IS NOT CONTROLLED WITH YOUR NAUSEA MEDICATION ?*UNUSUAL SHORTNESS OF BREATH ?*UNUSUAL BRUISING OR BLEEDING ?*URINARY PROBLEMS (pain or burning when urinating, or frequent urination) ?*BOWEL PROBLEMS (unusual diarrhea, constipation, pain near the anus) ?TENDERNESS IN MOUTH AND THROAT WITH OR WITHOUT PRESENCE OF ULCERS (sore throat, sores in mouth, or a toothache) ?UNUSUAL RASH, SWELLING OR PAIN  ?UNUSUAL VAGINAL DISCHARGE OR ITCHING  ? ?Items with * indicate a potential emergency and should be followed up as soon as possible or go to the Emergency Department if any problems should occur. ? ?Please show the CHEMOTHERAPY ALERT CARD or IMMUNOTHERAPY ALERT CARD at check-in to the Emergency  Department and triage nurse. ? ?Should you have questions after your visit or need to cancel or reschedule your appointment, please contact [facility] [phone]  and follow the prompts.  Office hours are 8:00 a.m. to 4:30 p.m. Monday - Friday. Please note that voicemails left after 4:00 p.m. may not be returned until the following business day.  We are closed weekends and major holidays. You have access to a nurse at all times for urgent questions. Please call the main number to the clinic [phone] and follow the prompts. ? ?For any non-urgent questions, you may also contact your provider using MyChart. We now offer e-Visits for anyone 69 and older to request care online for non-urgent symptoms. For details visit [email or web address]. ?  ?Also download the MyChart app! Go to the app store, search "MyChart", open the app, select [place], and log in with your MyChart username and password. ? ?Due to Covid, a mask is required upon entering the hospital/clinic. If you do not have a mask, one will be given to you upon arrival. For doctor visits, patients may have 1 support person aged 80 or older with them. For treatment visits, patients cannot have anyone with them due to current Covid guidelines and our immunocompromised population.  ?

## 2021-07-09 NOTE — Progress Notes (Signed)
Patient arrived today for chemo-pump disconnection. Port flushed with good blood return noted. No bruising or swelling at site. Bandaid applied and patient discharged in satisfactory condition. VVS stable with no signs or symptoms of distressed noted.  ?

## 2022-03-10 ENCOUNTER — Other Ambulatory Visit: Payer: Self-pay | Admitting: Oncology

## 2022-03-10 DIAGNOSIS — C189 Malignant neoplasm of colon, unspecified: Secondary | ICD-10-CM

## 2022-05-30 IMAGING — US US EXTREM LOW VENOUS*R*
1 series · 13 of 24 positions shown · non-contrast
Comparison: None.

CLINICAL DATA: Right lower thigh pain

EXAM:
Right LOWER EXTREMITY VENOUS DOPPLER ULTRASOUND
TECHNIQUE: Gray-scale sonography with compression, as well as color and duplex
ultrasound, were performed to evaluate the deep venous system(s)
from the level of the common femoral vein through the popliteal and
proximal calf veins.

[Series 1: us venous img lower uni right (dvt) · portal-venous · 13 of 55 slices shown]
[im 1/55]
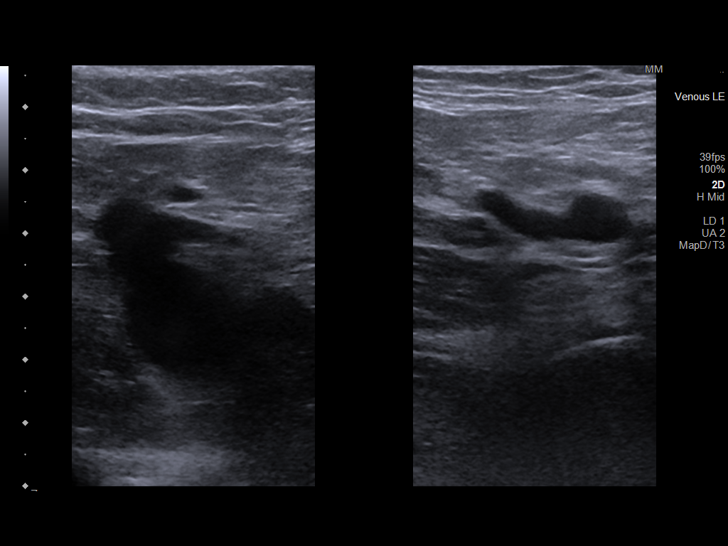
[im 5/55]
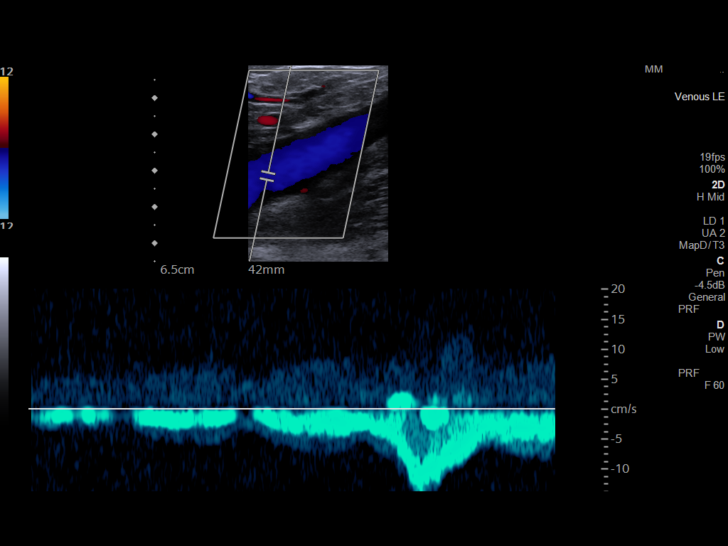
[im 10/55]
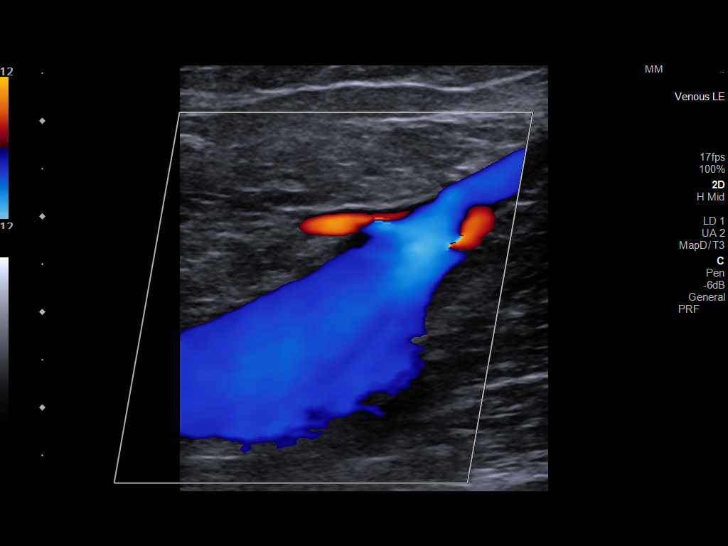
[im 15/55]
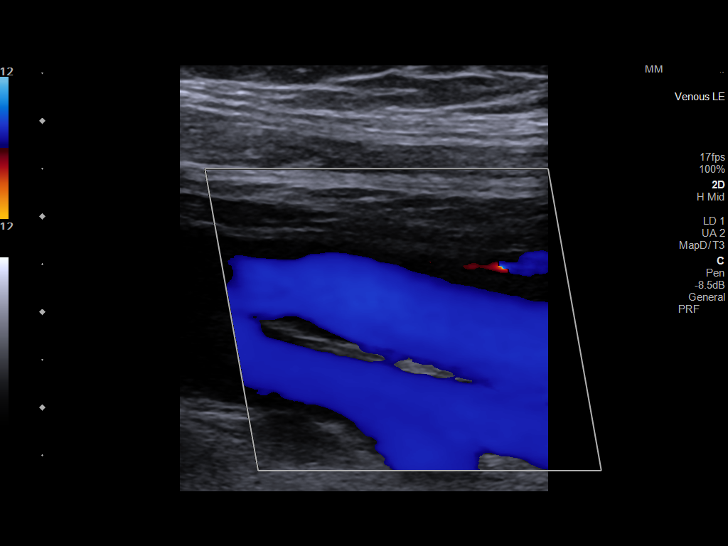
[im 19/55]
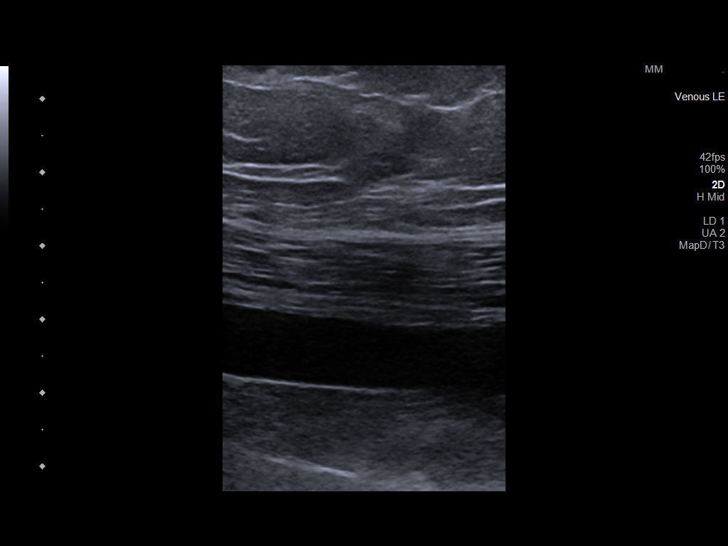
[im 24/55]
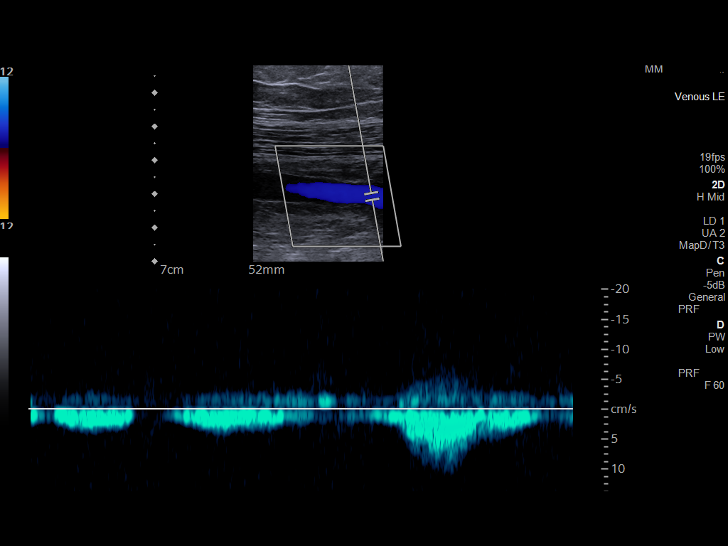
[im 29/55]
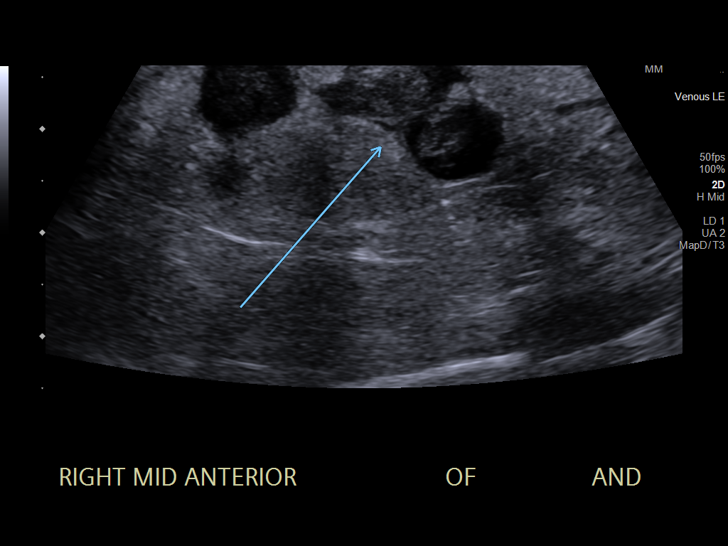
[im 31/55]
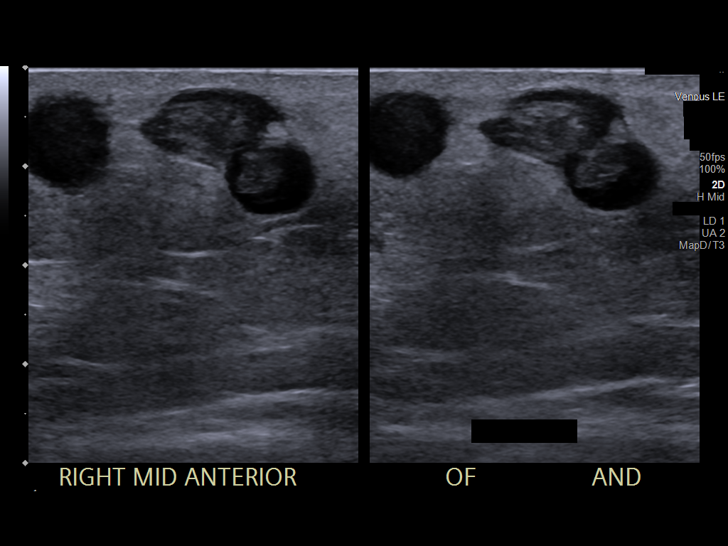
[im 36/55]
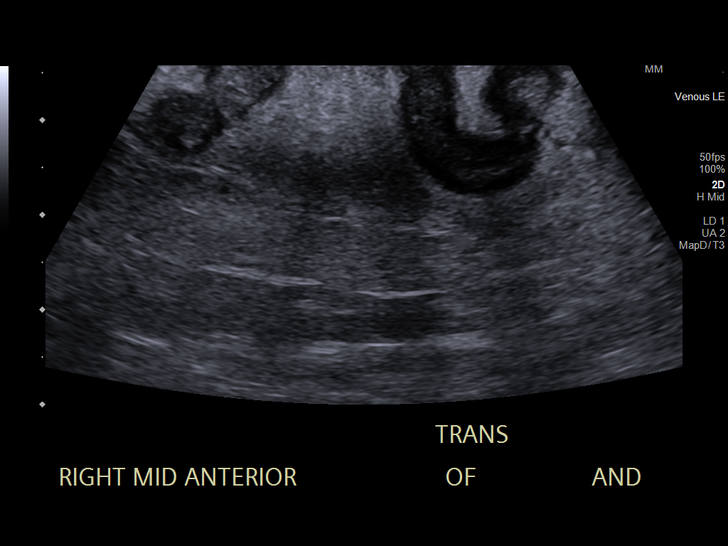
[im 40/55]
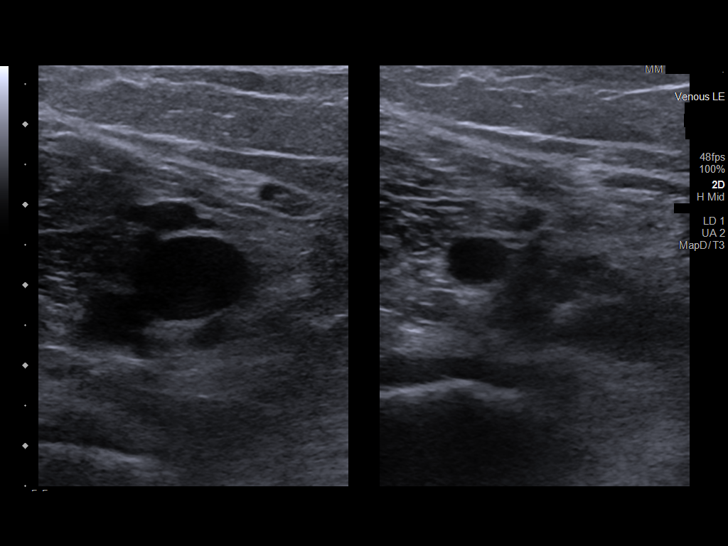
[im 45/55]
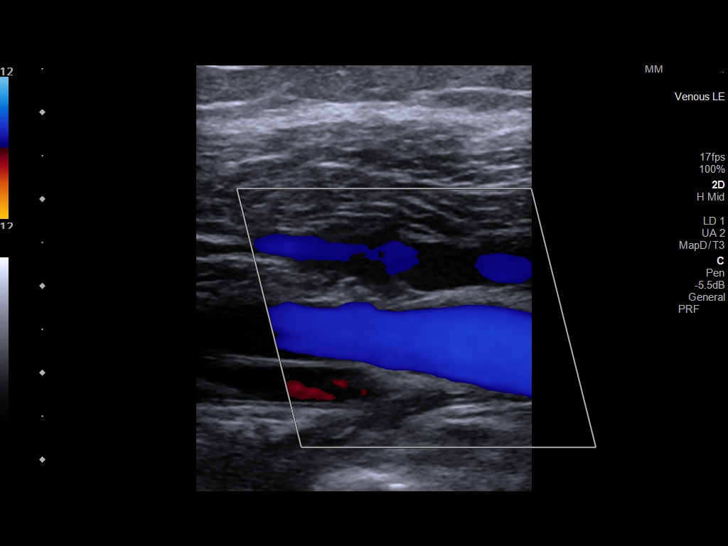
[im 50/55]
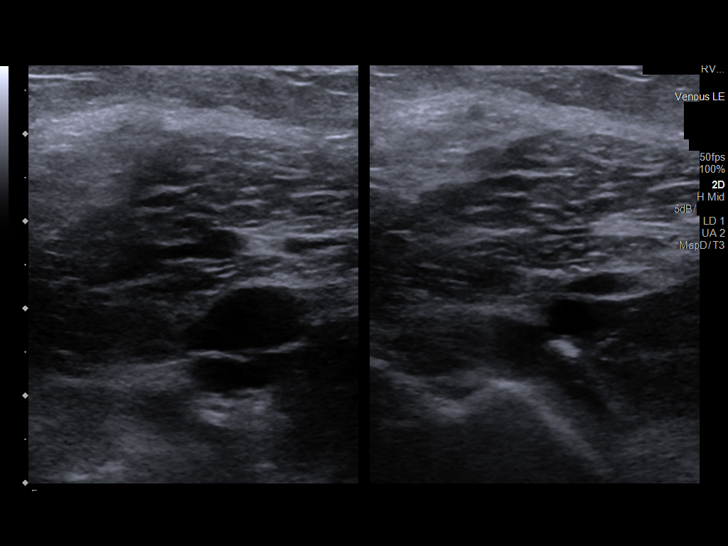
[im 55/55]
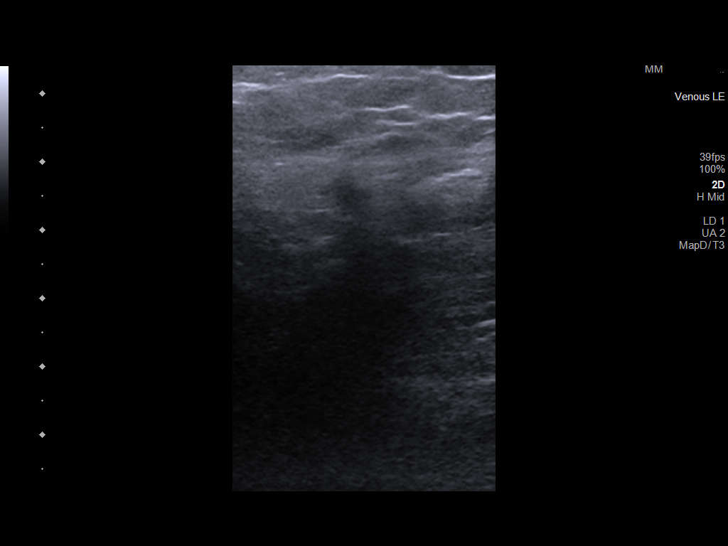

[13 of 24 positions shown; findings below may reference images not displayed]

FINDINGS: VENOUS

Normal compressibility of the common femoral, superficial femoral,
and popliteal veins, as well as the visualized calf veins.
Visualized portions of profunda femoral vein and great saphenous
vein unremarkable. No filling defects to suggest DVT on grayscale or
color Doppler imaging. Doppler waveforms show normal direction of
venous flow, normal respiratory plasticity and response to
augmentation.

Limited views of the contralateral common femoral vein are
unremarkable.

OTHER

Superficial varicosities in the region of patient's reported pain in
the mid anterior thigh. There is superficial venous thrombosis of
the varicosities.

Limitations: Edema within the thigh soft tissues around the
thrombosed varicosities.
IMPRESSION: 1. Negative for acute DVT of the right lower extremity
2. In the region of redness and pain/injury, there is thrombosed
superficial venous varicosity.

## 2023-09-29 IMAGING — CT NM PET TUM IMG INITIAL (PI) SKULL BASE T - THIGH
9 series · 25 of 25 positions shown · non-contrast
Comparison: CT chest abdomen pelvis 05/25/2021.

CLINICAL DATA: Subsequent treatment strategy for colon cancer.

EXAM:
NUCLEAR MEDICINE PET SKULL BASE TO THIGH
TECHNIQUE: 15.2 mCi F-18 FDG was injected intravenously. Full-ring PET imaging
was performed from the skull base to thigh after the radiotracer. CT
data was obtained and used for attenuation correction and anatomic
localization.
Fasting blood glucose: 120 mg/dl

[Series 3: ct wb 5.0 b30f · axial · 5.0mm · 0.98mm/px · z∈[-1468,-484]mm · 3 of 329 slices shown]
[im 1/329]
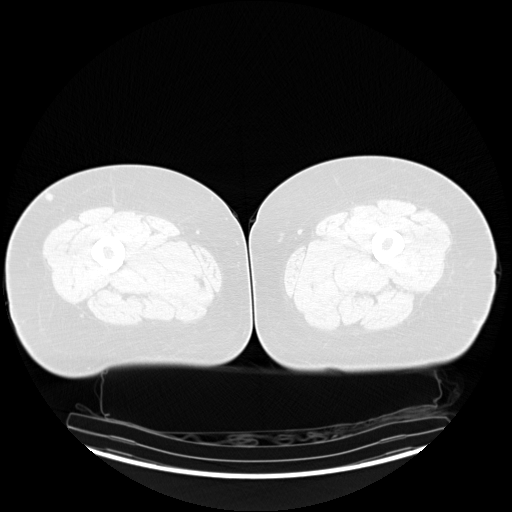
[im 165/329]
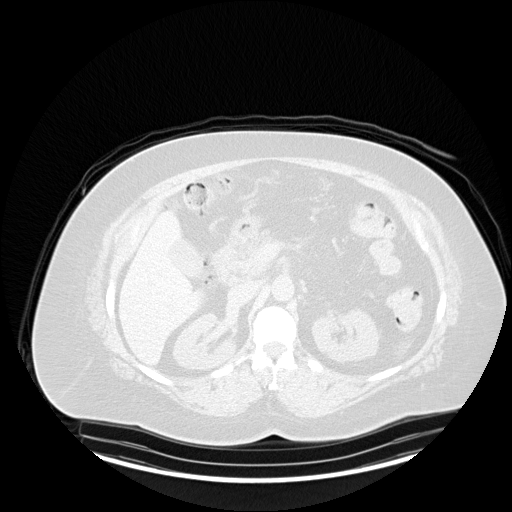
[im 329/329]
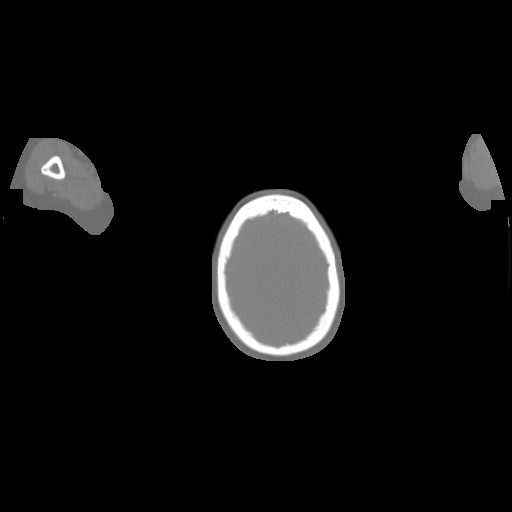

[Series 5: pet wb uncorrected (nac) · axial · 5.0mm · 4.07mm/px · z∈[-1468,-484]mm · 4 of 329 slices shown]
[im 1/329]
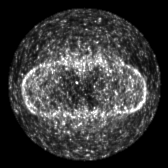
[im 110/329]
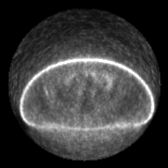
[im 219/329]
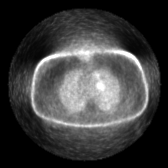
[im 329/329]
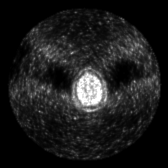

[Series 6: pet wb (ac) · axial · 5.0mm · 3.39mm/px · z∈[-1468,-484]mm · 4 of 329 slices shown]
[im 1/329]
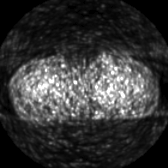
[im 110/329]
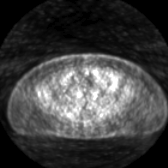
[im 219/329]
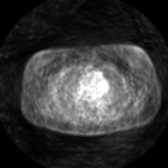
[im 329/329]
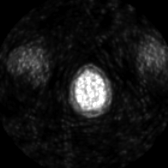

[Series 603: fused axial · 4 of 329 slices shown]
[im 1/329]
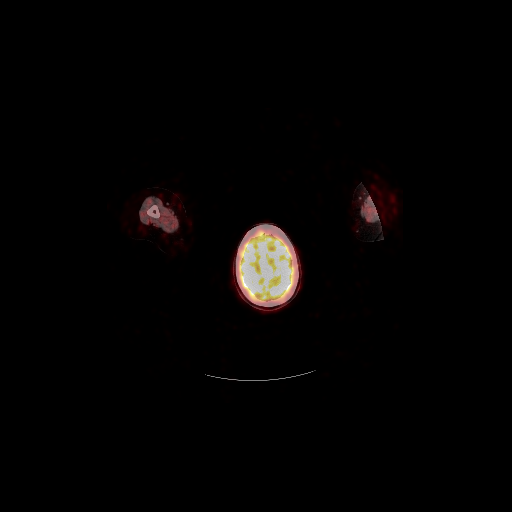
[im 110/329]
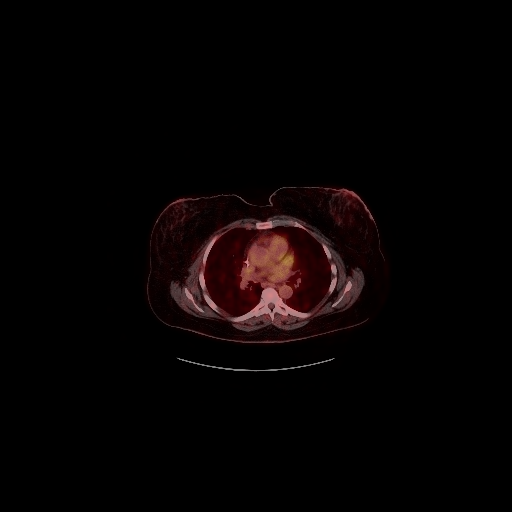
[im 219/329]
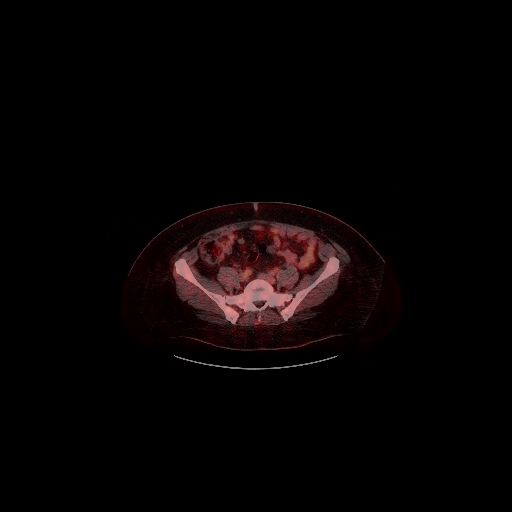
[im 329/329]
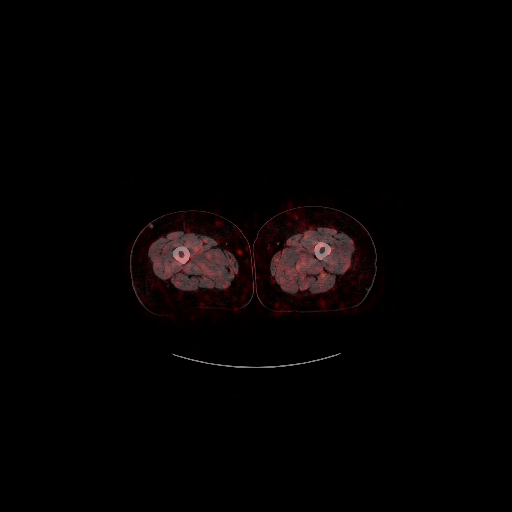

[Series 604: fused coronal · 1 of 113 slices shown]
[im 1/113]
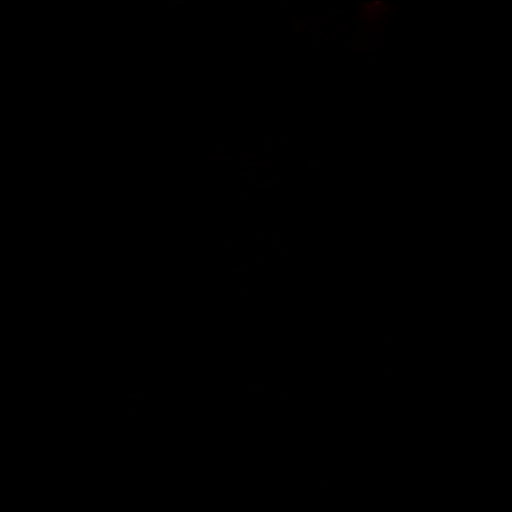

[Series 605: fused sagittal · 2 of 171 slices shown]
[im 1/171]
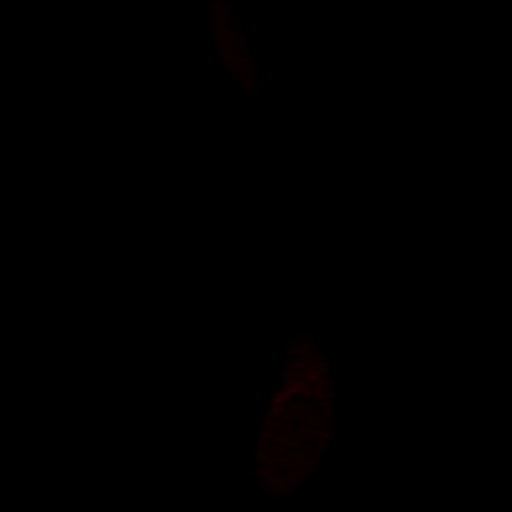
[im 171/171]
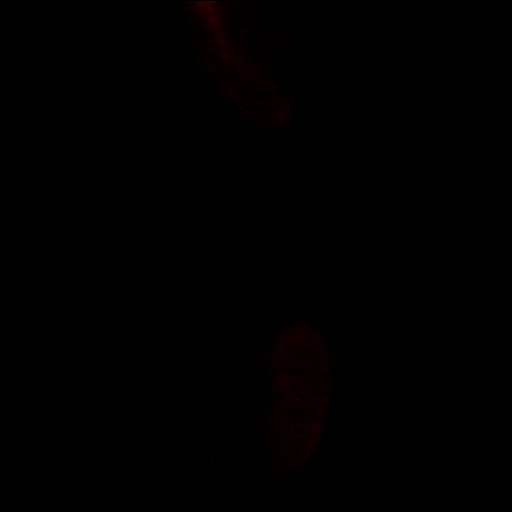

[Series 606: pet axial · 4 of 327 slices shown]
[im 1/327]
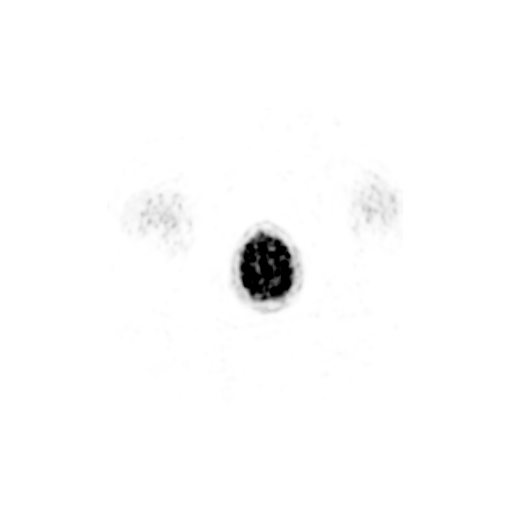
[im 109/327]
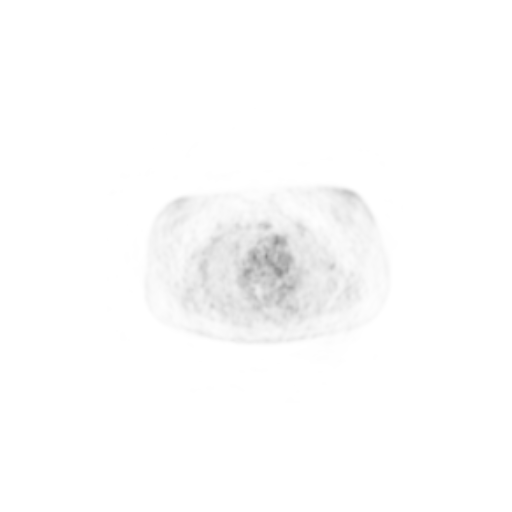
[im 218/327]
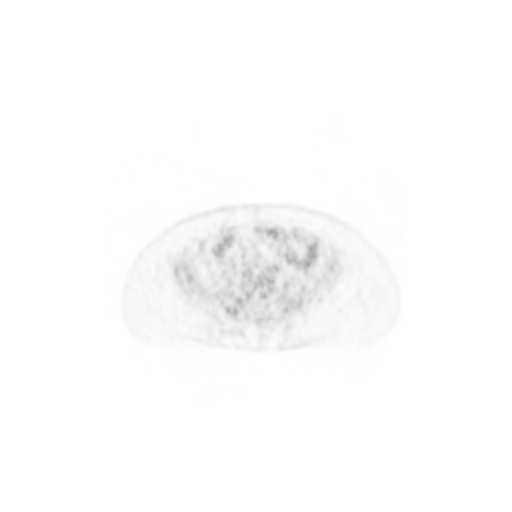
[im 327/327]
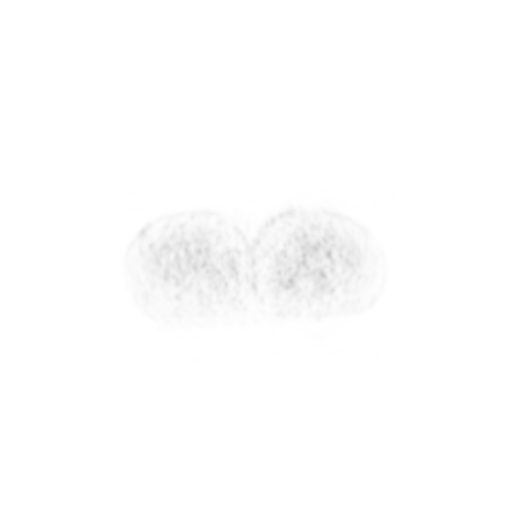

[Series 607: pet coronal · 1 of 121 slices shown]
[im 1/121]
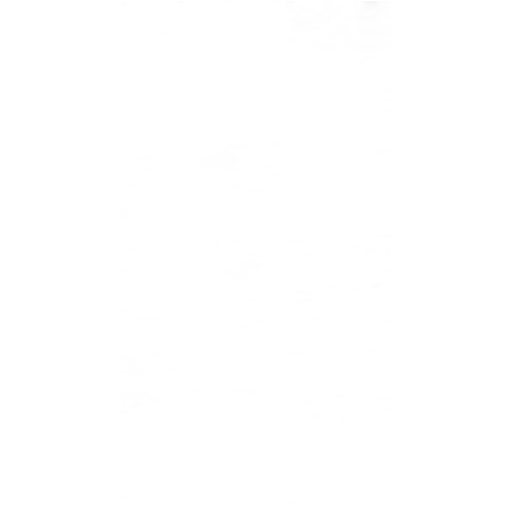

[Series 608: pet sagittal · 2 of 190 slices shown]
[im 1/190]
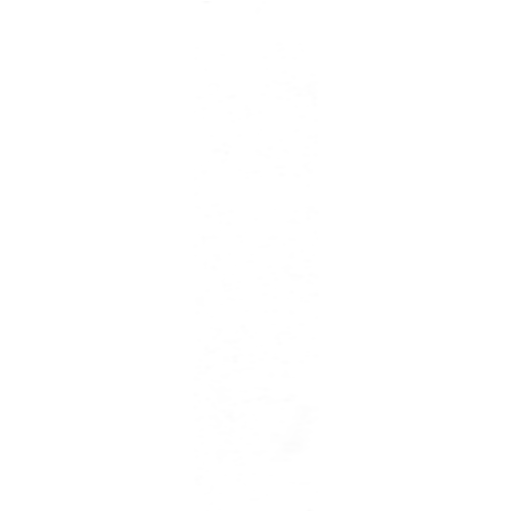
[im 190/190]
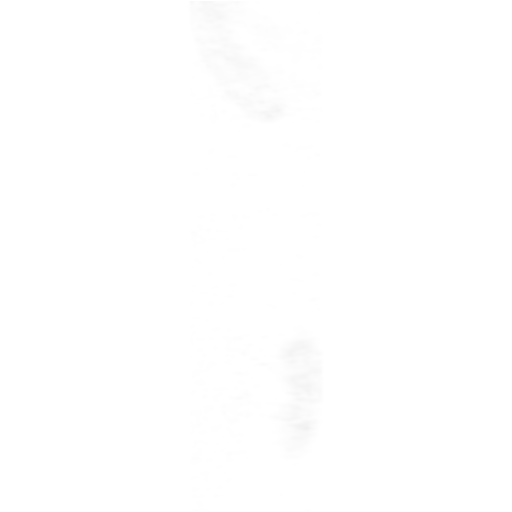

[25 of 25 positions shown; findings below may reference images not displayed]

FINDINGS: Mediastinal blood pool activity: SUV max

Liver activity: SUV max NA

NECK: No hypermetabolic lymph nodes in the neck.

Incidental CT findings: none

CHEST: No hypermetabolic mediastinal or hilar nodes. No suspicious
pulmonary nodules on the CT scan.

Incidental CT findings: Right Port-A-Cath tip is positioned in the
right atrium. Tiny hiatal hernia noted.

ABDOMEN/PELVIS: Subcapsular liver metastases in the dome of the
liver were better seen on the previous CT performed with intravenous
contrast material. The more anterior lesion measures approximately
2.4 cm today with SUV max = 6.9. The more posterior lesion measures
approximately 3.7 cm today (139/3) with SUV max = 8.4. 3rd lesion
identified posterior right liver with overlying capsular retraction
is similar to prior CT and shows no hypermetabolism today (axial CT
image 141/3).

14 mm short axis lymph node anterior to the aortic bifurcation is
markedly hypermetabolic with SUV max = 9.8.

No evidence for hypermetabolism in the right lower quadrant/right
adnexal region in the area of apparent scarring/tethering described
in the report for the previous CT scan.

Incidental CT findings: Bladder is decompressed which may account
for the a plan circumferential bladder wall thickening. Retained
contrast or appendicolith noted in the base of the appendix.

SKELETON: No focal hypermetabolic activity to suggest skeletal
metastasis.

Incidental CT findings: none
IMPRESSION: 1. Hypermetabolic subcapsular metastases identified anteriorly and
posteriorly in the hepatic dome. There is a third low-attenuation
lesion in the posterior right liver with overlying capsular
retraction and demonstrates no hypermetabolism on today's PET study.
2. 14 mm short axis retroperitoneal lymph node anterior to the
aortic bifurcation is stable since 05/25/2021 and demonstrates
marked hypermetabolism consistent with metastatic disease.
3. No other hypermetabolic metastatic disease identified in the
neck, chest, abdomen, or pelvis.
4. Tiny hiatal hernia.
